# Patient Record
Sex: Male | Born: 1950 | Race: Black or African American | Hispanic: No | Marital: Single | Smoking: Current every day smoker
Health system: Southern US, Community
[De-identification: ages and names within clinical notes are randomized; demographics above are authoritative.]

## PROBLEM LIST (undated history)

## (undated) DIAGNOSIS — F419 Anxiety disorder, unspecified: Secondary | ICD-10-CM

## (undated) DIAGNOSIS — F1410 Cocaine abuse, uncomplicated: Secondary | ICD-10-CM

## (undated) DIAGNOSIS — R7303 Prediabetes: Secondary | ICD-10-CM

## (undated) DIAGNOSIS — N433 Hydrocele, unspecified: Secondary | ICD-10-CM

## (undated) DIAGNOSIS — L309 Dermatitis, unspecified: Secondary | ICD-10-CM

## (undated) DIAGNOSIS — E785 Hyperlipidemia, unspecified: Secondary | ICD-10-CM

## (undated) DIAGNOSIS — T7840XA Allergy, unspecified, initial encounter: Secondary | ICD-10-CM

## (undated) DIAGNOSIS — I10 Essential (primary) hypertension: Secondary | ICD-10-CM

## (undated) DIAGNOSIS — M1990 Unspecified osteoarthritis, unspecified site: Secondary | ICD-10-CM

## (undated) DIAGNOSIS — R351 Nocturia: Secondary | ICD-10-CM

## (undated) DIAGNOSIS — D649 Anemia, unspecified: Secondary | ICD-10-CM

## (undated) DIAGNOSIS — K219 Gastro-esophageal reflux disease without esophagitis: Secondary | ICD-10-CM

## (undated) DIAGNOSIS — F32A Depression, unspecified: Secondary | ICD-10-CM

## (undated) DIAGNOSIS — E119 Type 2 diabetes mellitus without complications: Secondary | ICD-10-CM

## (undated) HISTORY — DX: Allergy, unspecified, initial encounter: T78.40XA

## (undated) HISTORY — DX: Anemia, unspecified: D64.9

## (undated) HISTORY — DX: Dermatitis, unspecified: L30.9

## (undated) HISTORY — DX: Hyperlipidemia, unspecified: E78.5

## (undated) HISTORY — PX: TONSILLECTOMY: SUR1361

## (undated) HISTORY — PX: WISDOM TOOTH EXTRACTION: SHX21

## (undated) HISTORY — DX: Anxiety disorder, unspecified: F41.9

## (undated) HISTORY — DX: Depression, unspecified: F32.A

## (undated) HISTORY — DX: Type 2 diabetes mellitus without complications: E11.9

## (undated) HISTORY — DX: Unspecified osteoarthritis, unspecified site: M19.90

## (undated) HISTORY — DX: Prediabetes: R73.03

## (undated) HISTORY — DX: Gastro-esophageal reflux disease without esophagitis: K21.9

---

## 1999-04-06 ENCOUNTER — Emergency Department (HOSPITAL_COMMUNITY): Admission: EM | Admit: 1999-04-06 | Discharge: 1999-04-06 | Payer: Self-pay | Admitting: Emergency Medicine

## 2003-02-03 ENCOUNTER — Encounter: Payer: Self-pay | Admitting: Emergency Medicine

## 2003-02-03 ENCOUNTER — Inpatient Hospital Stay (HOSPITAL_COMMUNITY): Admission: EM | Admit: 2003-02-03 | Discharge: 2003-02-04 | Payer: Self-pay | Admitting: Emergency Medicine

## 2004-03-04 ENCOUNTER — Emergency Department (HOSPITAL_COMMUNITY): Admission: EM | Admit: 2004-03-04 | Discharge: 2004-03-04 | Payer: Self-pay

## 2005-04-19 ENCOUNTER — Ambulatory Visit: Payer: Self-pay | Admitting: Endocrinology

## 2014-12-27 DIAGNOSIS — N433 Hydrocele, unspecified: Secondary | ICD-10-CM

## 2014-12-27 DIAGNOSIS — E785 Hyperlipidemia, unspecified: Secondary | ICD-10-CM

## 2014-12-27 DIAGNOSIS — R7303 Prediabetes: Secondary | ICD-10-CM

## 2014-12-27 DIAGNOSIS — E782 Mixed hyperlipidemia: Secondary | ICD-10-CM | POA: Insufficient documentation

## 2014-12-27 DIAGNOSIS — I10 Essential (primary) hypertension: Secondary | ICD-10-CM

## 2014-12-27 DIAGNOSIS — R351 Nocturia: Secondary | ICD-10-CM

## 2014-12-27 HISTORY — DX: Hydrocele, unspecified: N43.3

## 2014-12-27 HISTORY — DX: Essential (primary) hypertension: I10

## 2014-12-27 HISTORY — DX: Prediabetes: R73.03

## 2014-12-27 HISTORY — DX: Nocturia: R35.1

## 2014-12-27 HISTORY — DX: Hyperlipidemia, unspecified: E78.5

## 2015-11-11 ENCOUNTER — Other Ambulatory Visit: Payer: Self-pay | Admitting: Urology

## 2015-12-01 ENCOUNTER — Encounter (HOSPITAL_BASED_OUTPATIENT_CLINIC_OR_DEPARTMENT_OTHER): Payer: Self-pay | Admitting: *Deleted

## 2015-12-02 ENCOUNTER — Encounter (HOSPITAL_BASED_OUTPATIENT_CLINIC_OR_DEPARTMENT_OTHER): Payer: Self-pay | Admitting: *Deleted

## 2015-12-02 NOTE — Progress Notes (Signed)
NPO AFTER MN.  ARRIVE AT 0745.  NEEDS ISTAT,  URINE DRUG SCREEN, AND EKG.

## 2015-12-05 ENCOUNTER — Ambulatory Visit (HOSPITAL_BASED_OUTPATIENT_CLINIC_OR_DEPARTMENT_OTHER): Payer: Self-pay | Admitting: Anesthesiology

## 2015-12-05 ENCOUNTER — Ambulatory Visit (HOSPITAL_BASED_OUTPATIENT_CLINIC_OR_DEPARTMENT_OTHER)
Admission: RE | Admit: 2015-12-05 | Discharge: 2015-12-05 | Disposition: A | Discharge status: Home / Self Care | Payer: Self-pay | Source: Ambulatory Visit | Attending: Urology | Admitting: Urology

## 2015-12-05 ENCOUNTER — Encounter (HOSPITAL_BASED_OUTPATIENT_CLINIC_OR_DEPARTMENT_OTHER): Payer: Self-pay

## 2015-12-05 ENCOUNTER — Encounter (HOSPITAL_BASED_OUTPATIENT_CLINIC_OR_DEPARTMENT_OTHER)
Admission: RE | Disposition: A | Discharge status: Home / Self Care | Payer: Self-pay | Source: Ambulatory Visit | Attending: Urology

## 2015-12-05 DIAGNOSIS — F17210 Nicotine dependence, cigarettes, uncomplicated: Secondary | ICD-10-CM | POA: Insufficient documentation

## 2015-12-05 DIAGNOSIS — N4340 Spermatocele of epididymis, unspecified: Secondary | ICD-10-CM | POA: Insufficient documentation

## 2015-12-05 DIAGNOSIS — I10 Essential (primary) hypertension: Secondary | ICD-10-CM | POA: Insufficient documentation

## 2015-12-05 HISTORY — PX: HYDROCELE EXCISION: SHX482

## 2015-12-05 HISTORY — DX: Essential (primary) hypertension: I10

## 2015-12-05 HISTORY — DX: Nocturia: R35.1

## 2015-12-05 HISTORY — DX: Hydrocele, unspecified: N43.3

## 2015-12-05 HISTORY — DX: Cocaine abuse, uncomplicated: F14.10

## 2015-12-05 HISTORY — PX: ORCHIOPEXY: SHX479

## 2015-12-05 LAB — POCT I-STAT 4, (NA,K, GLUC, HGB,HCT)
GLUCOSE: 104 mg/dL — AB (ref 65–99)
HEMATOCRIT: 40 % (ref 39.0–52.0)
HEMOGLOBIN: 13.6 g/dL (ref 13.0–17.0)
Potassium: 4.2 mmol/L (ref 3.5–5.1)
SODIUM: 138 mmol/L (ref 135–145)

## 2015-12-05 SURGERY — HYDROCELECTOMY
Anesthesia: General | Site: Scrotum | Laterality: Right

## 2015-12-05 MED ORDER — PHENYLEPHRINE HCL 10 MG/ML IJ SOLN
INTRAMUSCULAR | Status: DC | PRN
  Administered 2015-12-05 (×5): 80 ug via INTRAVENOUS

## 2015-12-05 MED ORDER — MIDAZOLAM HCL 2 MG/2ML IJ SOLN
INTRAMUSCULAR | Status: AC
  Filled 2015-12-05: qty 2

## 2015-12-05 MED ORDER — MEPERIDINE HCL 25 MG/ML IJ SOLN
6.2500 mg | INTRAMUSCULAR | Status: DC | PRN
  Filled 2015-12-05: qty 1

## 2015-12-05 MED ORDER — ONDANSETRON HCL 4 MG/2ML IJ SOLN
INTRAMUSCULAR | Status: AC
  Filled 2015-12-05: qty 2

## 2015-12-05 MED ORDER — SULFAMETHOXAZOLE-TRIMETHOPRIM 800-160 MG PO TABS: 1.0000 | ORAL_TABLET | Freq: Two times a day (BID) | ORAL | Status: DC

## 2015-12-05 MED ORDER — KETOROLAC TROMETHAMINE 30 MG/ML IJ SOLN
INTRAMUSCULAR | Status: AC
  Filled 2015-12-05: qty 1

## 2015-12-05 MED ORDER — HYDROMORPHONE HCL 1 MG/ML IJ SOLN
INTRAMUSCULAR | Status: AC
  Filled 2015-12-05: qty 1

## 2015-12-05 MED ORDER — ONDANSETRON HCL 4 MG/2ML IJ SOLN
INTRAMUSCULAR | Status: DC | PRN
  Administered 2015-12-05: 4 mg via INTRAVENOUS

## 2015-12-05 MED ORDER — FENTANYL CITRATE (PF) 100 MCG/2ML IJ SOLN
INTRAMUSCULAR | Status: DC | PRN
  Administered 2015-12-05 (×4): 50 ug via INTRAVENOUS

## 2015-12-05 MED ORDER — SENNOSIDES-DOCUSATE SODIUM 8.6-50 MG PO TABS: 1.0000 | ORAL_TABLET | Freq: Two times a day (BID) | ORAL | Status: DC

## 2015-12-05 MED ORDER — DEXAMETHASONE SODIUM PHOSPHATE 4 MG/ML IJ SOLN
INTRAMUSCULAR | Status: DC | PRN
  Administered 2015-12-05: 10 mg via INTRAVENOUS

## 2015-12-05 MED ORDER — LIDOCAINE HCL (CARDIAC) 20 MG/ML IV SOLN
INTRAVENOUS | Status: AC
  Filled 2015-12-05: qty 5

## 2015-12-05 MED ORDER — PROPOFOL 10 MG/ML IV BOLUS
INTRAVENOUS | Status: DC | PRN
  Administered 2015-12-05: 50 mg via INTRAVENOUS
  Administered 2015-12-05: 200 mg via INTRAVENOUS

## 2015-12-05 MED ORDER — DEXAMETHASONE SODIUM PHOSPHATE 10 MG/ML IJ SOLN
INTRAMUSCULAR | Status: AC
  Filled 2015-12-05: qty 1

## 2015-12-05 MED ORDER — CLINDAMYCIN PHOSPHATE 900 MG/50ML IV SOLN
900.0000 mg | INTRAVENOUS | Status: AC
  Administered 2015-12-05: 900 mg via INTRAVENOUS
  Filled 2015-12-05: qty 50

## 2015-12-05 MED ORDER — LIDOCAINE HCL (CARDIAC) 20 MG/ML IV SOLN
INTRAVENOUS | Status: DC | PRN
  Administered 2015-12-05: 100 mg via INTRAVENOUS

## 2015-12-05 MED ORDER — FENTANYL CITRATE (PF) 100 MCG/2ML IJ SOLN
INTRAMUSCULAR | Status: AC
  Filled 2015-12-05: qty 2

## 2015-12-05 MED ORDER — CLINDAMYCIN PHOSPHATE 900 MG/50ML IV SOLN
INTRAVENOUS | Status: AC
  Filled 2015-12-05: qty 50

## 2015-12-05 MED ORDER — HYDROMORPHONE HCL 1 MG/ML IJ SOLN
0.2500 mg | INTRAMUSCULAR | Status: DC | PRN
  Administered 2015-12-05 (×2): 0.5 mg via INTRAVENOUS
  Filled 2015-12-05: qty 1

## 2015-12-05 MED ORDER — BUPIVACAINE HCL (PF) 0.25 % IJ SOLN
INTRAMUSCULAR | Status: DC | PRN
  Administered 2015-12-05: 10 mL

## 2015-12-05 MED ORDER — BUPIVACAINE HCL (PF) 0.25 % IJ SOLN
INTRAMUSCULAR | Status: AC
  Filled 2015-12-05: qty 30

## 2015-12-05 MED ORDER — PROPOFOL 500 MG/50ML IV EMUL
INTRAVENOUS | Status: AC
  Filled 2015-12-05: qty 50

## 2015-12-05 MED ORDER — KETOROLAC TROMETHAMINE 30 MG/ML IJ SOLN
INTRAMUSCULAR | Status: DC | PRN
  Administered 2015-12-05: 30 mg via INTRAVENOUS

## 2015-12-05 MED ORDER — OXYCODONE HCL 5 MG/5ML PO SOLN
5.0000 mg | Freq: Once | ORAL | Status: DC | PRN
  Filled 2015-12-05: qty 5

## 2015-12-05 MED ORDER — OXYCODONE HCL 5 MG PO TABS
5.0000 mg | ORAL_TABLET | Freq: Once | ORAL | Status: DC | PRN
  Filled 2015-12-05: qty 1

## 2015-12-05 MED ORDER — MIDAZOLAM HCL 5 MG/5ML IJ SOLN
INTRAMUSCULAR | Status: DC | PRN
  Administered 2015-12-05: 2 mg via INTRAVENOUS

## 2015-12-05 MED ORDER — LACTATED RINGERS IV SOLN
INTRAVENOUS | Status: DC
  Administered 2015-12-05: 09:00:00 via INTRAVENOUS
  Filled 2015-12-05: qty 1000

## 2015-12-05 MED ORDER — PROMETHAZINE HCL 25 MG/ML IJ SOLN
6.2500 mg | INTRAMUSCULAR | Status: DC | PRN
  Filled 2015-12-05: qty 1

## 2015-12-05 MED ORDER — OXYCODONE-ACETAMINOPHEN 5-325 MG PO TABS: 1.0000 | ORAL_TABLET | Freq: Four times a day (QID) | ORAL | Status: DC | PRN

## 2015-12-05 SURGICAL SUPPLY — 48 items
BLADE CLIPPER SURG (BLADE) ×3 IMPLANT
BLADE HEX COATED 2.75 (ELECTRODE) ×3 IMPLANT
BLADE SURG 15 STRL LF DISP TIS (BLADE) ×1 IMPLANT
BLADE SURG 15 STRL SS (BLADE) ×2
BNDG GAUZE ELAST 4 BULKY (GAUZE/BANDAGES/DRESSINGS) ×3 IMPLANT
BRIEF STRETCH FOR OB PAD LRG (UNDERPADS AND DIAPERS) ×3 IMPLANT
CANISTER SUCT 3000ML PPV (MISCELLANEOUS) ×3 IMPLANT
COVER BACK TABLE 60X90IN (DRAPES) ×3 IMPLANT
COVER MAYO STAND STRL (DRAPES) ×3 IMPLANT
DISSECTOR ROUND CHERRY 3/8 STR (MISCELLANEOUS) IMPLANT
DRAIN PENROSE 18X1/2 LTX STRL (DRAIN) IMPLANT
DRAIN PENROSE 18X1/4 LTX STRL (WOUND CARE) ×3 IMPLANT
DRAPE PED LAPAROTOMY (DRAPES) ×3 IMPLANT
DRSG TEGADERM 4X4.75 (GAUZE/BANDAGES/DRESSINGS) IMPLANT
ELECT REM PT RETURN 9FT ADLT (ELECTROSURGICAL) ×3
ELECTRODE REM PT RTRN 9FT ADLT (ELECTROSURGICAL) ×1 IMPLANT
GLOVE BIO SURGEON STRL SZ7 (GLOVE) ×3 IMPLANT
GLOVE BIO SURGEON STRL SZ7.5 (GLOVE) ×3 IMPLANT
GLOVE BIOGEL PI IND STRL 7.5 (GLOVE) ×2 IMPLANT
GLOVE BIOGEL PI INDICATOR 7.5 (GLOVE) ×4
GOWN STRL REUS W/ TWL LRG LVL3 (GOWN DISPOSABLE) ×1 IMPLANT
GOWN STRL REUS W/ TWL XL LVL3 (GOWN DISPOSABLE) ×1 IMPLANT
GOWN STRL REUS W/TWL LRG LVL3 (GOWN DISPOSABLE) ×2
GOWN STRL REUS W/TWL XL LVL3 (GOWN DISPOSABLE) ×2
KIT ROOM TURNOVER WOR (KITS) ×3 IMPLANT
LIQUID BAND (GAUZE/BANDAGES/DRESSINGS) IMPLANT
MANIFOLD NEPTUNE II (INSTRUMENTS) IMPLANT
NEEDLE HYPO 25X1 1.5 SAFETY (NEEDLE) ×3 IMPLANT
NS IRRIG 500ML POUR BTL (IV SOLUTION) ×3 IMPLANT
PACK BASIN DAY SURGERY FS (CUSTOM PROCEDURE TRAY) ×3 IMPLANT
PENCIL BUTTON HOLSTER BLD 10FT (ELECTRODE) ×3 IMPLANT
SUPPORT SCROTAL LG STRP (MISCELLANEOUS) IMPLANT
SUPPORTER ATHLETIC LG (MISCELLANEOUS)
SUT ETHILON 3 0 PS 1 (SUTURE) IMPLANT
SUT MNCRL AB 4-0 PS2 18 (SUTURE) ×3 IMPLANT
SUT PROLENE 4 0 PS 2 18 (SUTURE) ×3 IMPLANT
SUT SILK 2 0 FS (SUTURE) ×3 IMPLANT
SUT VIC AB 2-0 SH 27 (SUTURE)
SUT VIC AB 2-0 SH 27XBRD (SUTURE) IMPLANT
SUT VIC AB 4-0 SH 27 (SUTURE) ×2
SUT VIC AB 4-0 SH 27XANBCTRL (SUTURE) ×1 IMPLANT
SYR CONTROL 10ML LL (SYRINGE) ×3 IMPLANT
TOWEL OR 17X24 6PK STRL BLUE (TOWEL DISPOSABLE) ×6 IMPLANT
TRAY DSU PREP LF (CUSTOM PROCEDURE TRAY) ×3 IMPLANT
TUBE CONNECTING 12'X1/4 (SUCTIONS) ×1
TUBE CONNECTING 12X1/4 (SUCTIONS) ×2 IMPLANT
WATER STERILE IRR 500ML POUR (IV SOLUTION) IMPLANT
YANKAUER SUCT BULB TIP NO VENT (SUCTIONS) ×3 IMPLANT

## 2015-12-05 NOTE — Anesthesia Procedure Notes (Signed)
Procedure Name: LMA Insertion Date/Time: 12/05/2015 8:44 AM Performed by: Norva PavlovALLAWAY, Dakwon Wenberg G Pre-anesthesia Checklist: Patient identified, Emergency Drugs available, Suction available and Patient being monitored Patient Re-evaluated:Patient Re-evaluated prior to inductionOxygen Delivery Method: Circle System Utilized Preoxygenation: Pre-oxygenation with 100% oxygen Intubation Type: IV induction Ventilation: Mask ventilation without difficulty LMA: LMA inserted LMA Size: 4.0 Number of attempts: 1 Airway Equipment and Method: bite block Placement Confirmation: positive ETCO2 Tube secured with: Tape Dental Injury: Teeth and Oropharynx as per pre-operative assessment

## 2015-12-05 NOTE — Transfer of Care (Signed)
Last Vitals:  Filed Vitals:   12/05/15 0753  BP: 140/62  Pulse: 85  Temp: 36.8 C  Resp: 16    Immediate Anesthesia Transfer of Care Note  Patient: [name]  Procedure(s) Performed: Procedure(s) (LRB): RIGHT HYDROCELECTOMY ADULT (Right) ORCHIOPEXY/ SPERMATOCELECTOMY  ADULT (Right)  Patient Location: PACU  Anesthesia Type: General  Level of Consciousness: awake, alert  and oriented  Airway & Oxygen Therapy: Patient Spontanous Breathing and Patient connected to face mask oxygen  Post-op Assessment: Report given to PACU RN and Post -op Vital signs reviewed and stable  Post vital signs: Reviewed and stable  Complications: No apparent anesthesia complications

## 2015-12-05 NOTE — Anesthesia Postprocedure Evaluation (Signed)
Anesthesia Post Note  Patient: Jeffrey Stafford  Procedure(s) Performed: Procedure(s) (LRB): RIGHT HYDROCELECTOMY ADULT (Right) ORCHIOPEXY/ SPERMATOCELECTOMY  ADULT (Right)  Patient location during evaluation: PACU Anesthesia Type: General Level of consciousness: sedated and patient cooperative Pain management: pain level controlled Vital Signs Assessment: post-procedure vital signs reviewed and stable Respiratory status: spontaneous breathing Cardiovascular status: stable Anesthetic complications: no    Last Vitals:  Filed Vitals:   12/05/15 1030 12/05/15 1035  BP: 135/67   Pulse: 73 70  Temp:    Resp: 17 14    Last Pain:  Filed Vitals:   12/05/15 1142  PainSc: 2                  [name]

## 2015-12-05 NOTE — H&P (Signed)
Jeffrey Stafford is an 64 y.o. male.    Chief Complaint: Pre-op Right Hydrocelectomy  HPI:   1 - Right Hydrocele - PT with Rt scrotal swelling x 2 years that is progressive. [facility] confirms hydrocele, no solid testicular masses. Estimated size 12cm. Exam also corroborate likelky hydrocele w/o hernia. THis has become quite bothersome interfearing with activites and ambulation.   PMH sig for HTN. His PCP is Julieanne MansonElizabeth Mulberry with [facility].  Today " Jeffrey Stafford " is seen to proceed with elective right hydrocelectomy. No interval fevers.    Past Medical History  Diagnosis Date  . Right hydrocele   . Hypertension   . Borderline diabetes   . Cocaine substance abuse   . Nocturia     Past Surgical History  Procedure Laterality Date  . No past surgeries      History reviewed. No pertinent family history. Social History:  reports that he has been smoking Cigarettes.  He has a 12.5 pack-year smoking history. He has never used smokeless tobacco. He reports that he uses illicit drugs (Cocaine). He reports that he does not drink alcohol.  Allergies: No Known Allergies  No prescriptions prior to admission    No results found for this or any previous visit (from the past 48 hour(s)). No results found.  Review of Systems  Constitutional: Negative.   HENT: Negative.   Eyes: Negative.   Respiratory: Negative.   Cardiovascular: Negative.   Gastrointestinal: Negative.   Genitourinary: Negative.   Musculoskeletal: Negative.   Skin: Negative.   Neurological: Negative.   Endo/Heme/Allergies: Negative.   Psychiatric/Behavioral: Negative.     Height 5\' 9"  (1.753 m), weight 77.111 kg (170 lb). Physical Exam  Constitutional: He appears well-developed.  HENT:  Head: Normocephalic.  Eyes: Pupils are equal, round, and reactive to light.  Cardiovascular: Normal rate.   Respiratory: Effort normal.  GI: Soft.  Genitourinary:  Right scrotal swelling  c/w known hydrocele  Musculoskeletal: Normal range of motion.  Neurological: He is alert.  Skin: Skin is warm.  Psychiatric: He has a normal mood and affect. His behavior is normal. Judgment and thought content normal.     Assessment/Plan    1 - Right Hydrocele -  We rediscussed operative hydrocelecotmy in detail including usual outpatient perioperative course with >50% of cases requiring temporary penrose drian to reduce risk of hematoma as well as importance of post-op activity limitation and scrotal support. We rediscussed risks including recurrence, hematoma / bleeding, infection as well as rare risks such as DVT, PE, MI, CVA, Mortality.   The patient voiced understanding and wants to proceed. Will leave scrotal drain given hydrocele size to help prevent hematoma.    Eulas Schweitzer 12/05/2015, 5:45 AM

## 2015-12-05 NOTE — Anesthesia Preprocedure Evaluation (Signed)
Anesthesia Evaluation  Patient identified by MRN, date of birth, ID band Patient awake    Reviewed: Allergy & Precautions, NPO status , Patient's Chart, lab work & pertinent test results  Airway Mallampati: II  TM Distance: >3 FB Neck ROM: Full    Dental no notable dental hx.    Pulmonary Current Smoker,    Pulmonary exam normal breath sounds clear to auscultation       Cardiovascular hypertension, negative cardio ROS Normal cardiovascular exam Rhythm:Regular Rate:Normal     Neuro/Psych negative neurological ROS  negative psych ROS   GI/Hepatic negative GI ROS, (+)     substance abuse  cocaine use,   Endo/Other  negative endocrine ROS  Renal/GU negative Renal ROS     Musculoskeletal negative musculoskeletal ROS (+)   Abdominal   Peds  Hematology negative hematology ROS (+)   Anesthesia Other Findings   Reproductive/Obstetrics                             Anesthesia Physical Anesthesia Plan  ASA: II  Anesthesia Plan: General   Post-op Pain Management:    Induction: Intravenous  Airway Management Planned: LMA  Additional Equipment:   Intra-op Plan:   Post-operative Plan: Extubation in OR  Informed Consent: I have reviewed the patients History and Physical, chart, labs and discussed the procedure including the risks, benefits and alternatives for the proposed anesthesia with the patient or authorized representative who has indicated his/her understanding and acceptance.   Dental advisory given  Plan Discussed with: CRNA  Anesthesia Plan Comments:         Anesthesia Quick Evaluation

## 2015-12-05 NOTE — Discharge Instructions (Signed)
1 - You may have small amount of blood drianage and moderate scrotal swelling with drain in place. This is normal.  2 - Call MD or go to ER for fever >102, severe pain / nausea / vomiting not relieved by medications, or acute change in medical status     HOME CARE INSTRUCTIONS FOR SCROTAL PROCEDURES  Wound Care & Hygiene: You may apply an ice bag to the scrotum for the first 24 hours.  This may help decrease swelling and soreness.  You may have a dressing held in place by an athletic supporter.  You may remove the dressing in 24 hours and shower in 48 hours.  Continue to use the athletic supporter or tight briefs for at least a week. Activity: Rest today - not necessarily flat bed rest.  Just take it easy.  You should not do strenuous activities until your follow-up visit with your doctor.  You may resume light activity in 48 hours.  Return to Work:  Your doctor will advise you of this depending on the type of work you do  Diet: Drink liquids or eat a light diet this evening.  You may resume a regular diet tomorrow.  General Expectations: You may have a small amount of bleeding.  The scrotum may be swollen or bruised for about a week.  Call your Doctor if these occur:  -persistent or heavy bleeding  -temperature of 101 degrees or more  -severe pain, not relieved by your pain medication  [facility]:  Call to set up and appointment.     Post Anesthesia Home Care Instructions  Activity: Get plenty of rest for the remainder of the day. A responsible adult should stay with you for 24 hours following the procedure.  For the next 24 hours, DO NOT: -Drive a car -Advertising copywriterperate machinery -Drink alcoholic beverages -Take any medication unless instructed by your physician -Make any legal decisions or sign important papers.  Meals: Start with liquid foods such as gelatin or soup. Progress to regular foods as tolerated. Avoid greasy, spicy, heavy foods. If nausea and/or  vomiting occur, drink only clear liquids until the nausea and/or vomiting subsides. Call your physician if vomiting continues.  Special Instructions/Symptoms: Your throat may feel dry or sore from the anesthesia or the breathing tube placed in your throat during surgery. If this causes discomfort, gargle with warm salt water. The discomfort should disappear within 24 hours.  If you had a scopolamine patch placed behind your ear for the management of post- operative nausea and/or vomiting:  1. The medication in the patch is effective for 72 hours, after which it should be removed.  Wrap patch in a tissue and discard in the trash. Wash hands thoroughly with soap and water. 2. You may remove the patch earlier than 72 hours if you experience unpleasant side effects which may include dry mouth, dizziness or visual disturbances. 3. Avoid touching the patch. Wash your hands with soap and water after contact with the patch.

## 2015-12-05 NOTE — Brief Op Note (Signed)
12/05/2015  9:31 AM  PATIENT:  Jeffrey Macconnell DemarkJames R Twyman  64 y.o. male  PRE-OPERATIVE DIAGNOSIS:  LARGE RIGHT HYDROCELE  POST-OPERATIVE DIAGNOSIS:  LARGE RIGHT HYDROCEL/ RIGHT SPERMATOCELE  PROCEDURE:  Procedure(s): RIGHT HYDROCELECTOMY ADULT (Right) ORCHIOPEXY/ SPERMATOCELECTOMY  ADULT (Right)  SURGEON:  Surgeon(s) and Role:    * Sebastian Acheheodore Dene Landsberg, MD - Primary  PHYSICIAN ASSISTANT:   ASSISTANTS: none   ANESTHESIA:   local and general  EBL:  Total I/O In: 200 [I.V.:200] Out: -   BLOOD ADMINISTERED:none  DRAINS: Penrose drain in the dependant scrotum to wound drainage   LOCAL MEDICATIONS USED:  MARCAINE     SPECIMEN:  Source of Specimen:  right spermatocele  DISPOSITION OF SPECIMEN:  PATHOLOGY  COUNTS:  YES  TOURNIQUET:  * No tourniquets in log *  DICTATION: .Other Dictation: Dictation Number [id number]  PLAN OF CARE: Discharge to home after PACU  PATIENT DISPOSITION:  PACU - hemodynamically stable.   Delay start of Pharmacological VTE agent (>24hrs) due to surgical blood loss or risk of bleeding: yes

## 2015-12-05 NOTE — Progress Notes (Signed)
Dr. Berneice HeinrichManny called back after page, reported that patient admits to cocaine use. Okay to proceed with surgery.

## 2015-12-08 ENCOUNTER — Encounter (HOSPITAL_BASED_OUTPATIENT_CLINIC_OR_DEPARTMENT_OTHER): Payer: Self-pay | Admitting: Urology

## 2015-12-08 NOTE — Op Note (Signed)
NAME:  Jeffrey Stafford, Jeffrey Stafford NO.:  [id number]  MEDICAL RECORD NO.:  [id number]  LOCATION:                               FACILITY:  [facility]  PHYSICIAN:  Sebastian Ache, MD     DATE OF BIRTH:  Jan 26, 1951  DATE OF PROCEDURE:  12/05/2015                              OPERATIVE REPORT  PREOPERATIVE DIAGNOSIS:  Right hydrocele versus spermatocele.  POSTOPERATIVE DIAGNOSIS:  Large right spermatocele and testicular mobility.  PROCEDURES: 1. Right spermatocelectomy. 2. Right orchidopexy.  ESTIMATED BLOOD LOSS:  Nil.  COMPLICATIONS:  None.  SPECIMENS:  Right spermatocele to permanent pathology.  FINDINGS: 1. Large right multilobular right spermatocele. 2. Excessive mobility of testicle and vas and vessels following     spermatocelectomy, this necessitated right orchidopexy.  DRAINS: 1. Penrose drain in the dependent scrotum. 2. Wound drainage.  INDICATION:  Jeffrey Stafford is a 64 year old gentleman with longstanding progressive history of right scrotal swelling and heaviness.  He was found on workup of this to have a fluid-filled structure consistent with likely hydrocele versus spermatocele on outside imaging and he adamantly wished for operative management.  We did discuss options including surveillance versus sclerotherapy versus surgical extirpation.  He wished to proceed with the latter.  Informed consent was obtained and placed in the medical record.  PROCEDURE IN DETAIL:  The patient being Jeffrey Stafford, was verified. Procedure being right hydrocelectomy versus spermatocelectomy was confirmed.  Procedure was carried out.  Time-out was performed. Intravenous antibiotics were administered.  General anesthesia was introduced.  The patient was placed into a supine position and sterile field was created by prepping and draping the patient's penis, perineum and scrotum using iodinated prep.  The incision was made along the median raphe approximately 4 cm along the  anterior dependent scrotum directly on the area of scrotal swelling.  Dissection was carried down through the dartos and outer tunical layers and the area of scrotal swelling was delivered into the operative field.  Upon inspection, this was found to be actually a large multiloculated spermatocele and not a hydrocele.  The vas and testicular vessels were identified and very carefully dissected away from the area of spermatocele as was the testicle was very carefully dissected away from the area of spermatocele.  Such that the spermatocele remained on its small epididymal stock, which was then transected with the cautery and the spermatocele was delivered for permanent pathology.  Following these maneuvers, the testicle appeared grossly viable.  There was no visible transection of the vas or gonadal vessels.  The testicle was very mobile and the vas and vessels were extended approximately 10 inches below the level of the scrotum.  It was felt that this was worrisome for an impending torsion without maneuvers to anchor the testicle and it was felt that clearly orchidopexy would be warranted.  As such, the testicle was carefully oriented with vessels not twisted and making sure that the lateral sulcus was lateral and orchidopexy was performed using a 4-0 Prolene anchoring to the right lateral hemiscrotum, dartos and the mesorchium testis on the right taking great care to avoid skin perforation, which did not occur.  A separate anchor stitch was applied inferiorly at  the inferior dartos and inferior mesorchium, again making sure no skin violation occurred.  Next, the Penrose drain was brought into the right hemiscrotum via an inferior counterincision less than 1 cm in length.  Drain stitch was applied to this.  Hemostasis appeared excellent.  The dartos was reapproximated using running Vicryl and the skin reapproximated using running Monocryl.  A dressing of fluff and mesh panties were  applied, and procedure was terminated.  The patient tolerated the procedure well.  There were no immediate periprocedural complications.  The patient was taken to the postanesthesia care unit in stable condition.          ______________________________ Sebastian Acheheodore Fread Kottke, MD     TM/MEDQ  D:  12/05/2015  T:  12/06/2015  Job:  [id number]

## 2015-12-28 DIAGNOSIS — L309 Dermatitis, unspecified: Secondary | ICD-10-CM | POA: Insufficient documentation

## 2015-12-28 HISTORY — DX: Dermatitis, unspecified: L30.9

## 2016-10-30 ENCOUNTER — Other Ambulatory Visit: Payer: Self-pay | Admitting: Internal Medicine

## 2016-12-30 ENCOUNTER — Encounter: Payer: Self-pay | Admitting: Internal Medicine

## 2016-12-30 ENCOUNTER — Ambulatory Visit (INDEPENDENT_AMBULATORY_CARE_PROVIDER_SITE_OTHER): Payer: Medicare Other | Admitting: Internal Medicine

## 2016-12-30 VITALS — BP 160/90 | HR 76 | Resp 12 | Ht 66.5 in | Wt 161.0 lb

## 2016-12-30 DIAGNOSIS — R918 Other nonspecific abnormal finding of lung field: Secondary | ICD-10-CM

## 2016-12-30 DIAGNOSIS — R7303 Prediabetes: Secondary | ICD-10-CM | POA: Diagnosis not present

## 2016-12-30 DIAGNOSIS — K029 Dental caries, unspecified: Secondary | ICD-10-CM | POA: Diagnosis not present

## 2016-12-30 DIAGNOSIS — F17200 Nicotine dependence, unspecified, uncomplicated: Secondary | ICD-10-CM | POA: Insufficient documentation

## 2016-12-30 DIAGNOSIS — L309 Dermatitis, unspecified: Secondary | ICD-10-CM | POA: Diagnosis not present

## 2016-12-30 DIAGNOSIS — E782 Mixed hyperlipidemia: Secondary | ICD-10-CM | POA: Diagnosis not present

## 2016-12-30 DIAGNOSIS — Z720 Tobacco use: Secondary | ICD-10-CM | POA: Insufficient documentation

## 2016-12-30 DIAGNOSIS — F1410 Cocaine abuse, uncomplicated: Secondary | ICD-10-CM | POA: Insufficient documentation

## 2016-12-30 DIAGNOSIS — H547 Unspecified visual loss: Secondary | ICD-10-CM | POA: Diagnosis not present

## 2016-12-30 DIAGNOSIS — I10 Essential (primary) hypertension: Secondary | ICD-10-CM

## 2016-12-30 DIAGNOSIS — R739 Hyperglycemia, unspecified: Secondary | ICD-10-CM

## 2016-12-30 LAB — GLUCOSE, POCT (MANUAL RESULT ENTRY): POC GLUCOSE: 116 mg/dL — AB (ref 70–99)

## 2016-12-30 MED ORDER — LISINOPRIL 10 MG PO TABS: ORAL_TABLET | ORAL | 11 refills | Status: DC

## 2016-12-30 NOTE — Patient Instructions (Signed)
Tobacco Cessation:   1800QUITNOW or [phone], the former for support and possibly free nicotine patches/gum and support; the latter for [facility] Smoking cessation class. Get rid of all smoking supplies:  Cigarettes, lighters, ashtrays--no stashes just in case at home if you are serious.  For nicotine patches:  Stop smoking anything the day you start the first patch Start with 21 mg patch and reapply new to different area of skin every 24 hours for 30 days. Then 14 mg patch changed every 24 hours for 14 days. Then 7 mg patch changed every 24 hours for 14 days.  Drink a glass of water before every meal Drink 6-8 glasses of water daily Eat three meals daily Eat a protein and healthy fat with every meal (eggs,fish, chicken, turkey and limit red meats) Eat 5 servings of vegetables daily, mix the colors Eat 2 servings of fruit daily with skin, if skin is edible Use smaller plates Put food/utensils down as you chew and swallow each bite Eat at a table with friends/family at least once daily, no TV Do not eat in front of the TV  

## 2016-12-30 NOTE — Progress Notes (Signed)
Subjective:    Patient ID: Jeffrey Stafford, male    DOB: 1951-07-09, 66 y.o.   MRN: [id number]  HPI   Here for first time since we moved to [place].  Previously in [facility]  1.  Decreased Visual acuity:  Vision gets blurred from time to time.  Has not used reading glasses.  Feels he has floaters at times in right eye.  Has not been to an eye doctor in some time.   2.  Dental:  Needs dental care--teeth falling out.  Does not brush teeth regularly no floss.  3.  Essential Hypertension:  Was off Lisinopril for a while until November.  Was taking regularly until about 10 days ago, when he lost his Rx.  Discussed he could fill early and just pay $4 out of pocket to fill again.  4.  Right fingertips in past month tingling and turning white.  No cyanosis or deep red.  Has occurred maybe 4-5 times when he gets cold.  Lasts about 10-15 minutes until he gets warm.  Current Meds  Medication Sig  . lisinopril (PRINIVIL,ZESTRIL) 10 MG tablet TAKE ONE TABLET BY MOUTH ONCE DAILY WITH MEALS   No Known Allergies   Past Medical History:  Diagnosis Date  . Cocaine substance abuse 2014-2015  . Eczema 2017  . Hyperlipidemia 2016  . Hypertension 2016  . Nocturia 2016  . Prediabetes 2016  . Right hydrocele 2016   Surgical removal 11/2015    Past Surgical History:  Procedure Laterality Date  . HYDROCELE EXCISION Right 12/05/2015   Procedure: RIGHT HYDROCELECTOMY ADULT;  Surgeon: Sebastian Acheheodore Manny, MD;  [facility];  Service: Urology;  Laterality: Right;  . ORCHIOPEXY Right 12/05/2015   Procedure: ORCHIOPEXY/ SPERMATOCELECTOMY  ADULT;  Surgeon: Sebastian Acheheodore Manny, MD;  [facility];  Service: Urology;  Laterality: Right;    Family History  Problem Relation Age of Onset  . Diabetes Mother   . Heart disease Mother     Valvular Cardiomyopathy  . Pulmonary embolism Father     complication of amputation of leg for gangrene  . Peripheral vascular disease  Father   . Alcohol abuse Father   . Cirrhosis Brother     alcoholic--had shunt  . Lung cancer Brother     had removal of spot from lung  . Alcohol abuse Brother     after death of son  . Drug abuse Brother     Following death of son  . Kidney disease Brother     not clear of cause:  exposure [place] something [place] in [place]  Peritoneal dialysis.  developed skin infection around catheter and died      Review of Systems  Cardiovascular:       No leg pain with physical activity/walking       Objective:   Physical Exam  NAD HEENT:  PERRL, EOMI, discs sharp, but unable to see well, TMs pearly gray, few remaining lower teeth.  Several teeth broken off in upper right gingiva. Neck:  Supple, no adenopathy or thyromegaly Chest:  Mildly decreased BS, rhonchi in posterior LLL with end expiratory wheeze even after deep cough. CV:  RRR with normal S1 and S2, No S3, S4 or murmur.  No carotid bruits.  Carotid, radial and left DP pulse normal and equal,  Right DP decreased        Assessment & Plan:  1.  Decreased Visual Acuity/floaters:  Referral to Dr Dione BoozeGroat.  2.  Dental Decay:  Handout with list of Medicaid Dental providers  3.  Essential Hypertension:  Encouraged never running out of medication.  Return for fasting CMP, CBC  4.  Hyperlipidemia:  Return for fasting lipids  5.  Prediabetes:  Return for A1C  6.  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  Avoid holding cold bottles or cans  7.  Tobacco Abuse: Gave contact information for support.  Recommended patches.  8.  Abnormal lung exam:  CXR particularly with history of long term tobacco use.

## 2017-01-06 ENCOUNTER — Other Ambulatory Visit: Payer: Medicare Other

## 2017-01-06 DIAGNOSIS — Z79899 Other long term (current) drug therapy: Secondary | ICD-10-CM | POA: Insufficient documentation

## 2017-01-06 DIAGNOSIS — R7303 Prediabetes: Secondary | ICD-10-CM

## 2017-01-06 DIAGNOSIS — E782 Mixed hyperlipidemia: Secondary | ICD-10-CM

## 2017-01-07 ENCOUNTER — Ambulatory Visit
Admission: RE | Admit: 2017-01-07 | Discharge: 2017-01-07 | Disposition: A | Discharge status: Home / Self Care | Payer: Medicaid Other | Source: Ambulatory Visit | Attending: Internal Medicine | Admitting: Internal Medicine

## 2017-01-07 DIAGNOSIS — R918 Other nonspecific abnormal finding of lung field: Secondary | ICD-10-CM

## 2017-01-07 LAB — COMPREHENSIVE METABOLIC PANEL
ALBUMIN: 4.3 g/dL (ref 3.6–4.8)
ALT: 21 IU/L (ref 0–44)
AST: 37 IU/L (ref 0–40)
Albumin/Globulin Ratio: 1.4 (ref 1.2–2.2)
Alkaline Phosphatase: 133 IU/L — ABNORMAL HIGH (ref 39–117)
BUN / CREAT RATIO: 8 — AB (ref 10–24)
BUN: 10 mg/dL (ref 8–27)
Bilirubin Total: 0.7 mg/dL (ref 0.0–1.2)
CALCIUM: 9.4 mg/dL (ref 8.6–10.2)
CO2: 24 mmol/L (ref 18–29)
Chloride: 95 mmol/L — ABNORMAL LOW (ref 96–106)
Creatinine, Ser: 1.27 mg/dL (ref 0.76–1.27)
GFR, EST AFRICAN AMERICAN: 68 mL/min/{1.73_m2} (ref >59)
GFR, EST NON AFRICAN AMERICAN: 59 mL/min/{1.73_m2} — AB (ref >59)
Globulin, Total: 3.1 g/dL (ref 1.5–4.5)
Glucose: 101 mg/dL — ABNORMAL HIGH (ref 65–99)
Potassium: 5.5 mmol/L — ABNORMAL HIGH (ref 3.5–5.2)
Sodium: 136 mmol/L (ref 134–144)
TOTAL PROTEIN: 7.4 g/dL (ref 6.0–8.5)

## 2017-01-07 LAB — CBC WITH DIFFERENTIAL/PLATELET
BASOS: 1 %
Basophils Absolute: 0 10*3/uL (ref 0.0–0.2)
EOS (ABSOLUTE): 0.3 10*3/uL (ref 0.0–0.4)
EOS: 3 %
HEMATOCRIT: 43 % (ref 37.5–51.0)
Hemoglobin: 13.7 g/dL (ref 13.0–17.7)
IMMATURE GRANS (ABS): 0 10*3/uL (ref 0.0–0.1)
IMMATURE GRANULOCYTES: 0 %
LYMPHS: 37 %
Lymphocytes Absolute: 3.3 10*3/uL — ABNORMAL HIGH (ref 0.7–3.1)
MCH: 28.1 pg (ref 26.6–33.0)
MCHC: 31.9 g/dL (ref 31.5–35.7)
MCV: 88 fL (ref 79–97)
Monocytes Absolute: 0.9 10*3/uL (ref 0.1–0.9)
Monocytes: 10 %
NEUTROS PCT: 49 %
Neutrophils Absolute: 4.4 10*3/uL (ref 1.4–7.0)
Platelets: 361 10*3/uL (ref 150–379)
RBC: 4.88 x10E6/uL (ref 4.14–5.80)
RDW: 15.2 % (ref 12.3–15.4)
WBC: 8.8 10*3/uL (ref 3.4–10.8)

## 2017-01-07 LAB — LIPID PANEL W/O CHOL/HDL RATIO
Cholesterol, Total: 224 mg/dL — ABNORMAL HIGH (ref 100–199)
HDL: 67 mg/dL (ref >39)
LDL CALC: 139 mg/dL — AB (ref 0–99)
Triglycerides: 88 mg/dL (ref 0–149)
VLDL CHOLESTEROL CAL: 18 mg/dL (ref 5–40)

## 2017-01-07 LAB — HGB A1C W/O EAG: Hgb A1c MFr Bld: 6.1 % — ABNORMAL HIGH (ref 4.8–5.6)

## 2017-01-11 NOTE — Progress Notes (Signed)
Spoke with patient results given. Patient wanted us to know he also saw the dentist today.

## 2017-01-14 NOTE — Progress Notes (Signed)
Spoke with patient and lab results given. Appointments scheduled for follow up labs

## 2017-01-28 ENCOUNTER — Other Ambulatory Visit: Payer: Self-pay

## 2017-03-01 ENCOUNTER — Ambulatory Visit: Payer: Medicaid Other | Admitting: Internal Medicine

## 2017-03-30 ENCOUNTER — Encounter: Payer: Self-pay | Admitting: Internal Medicine

## 2017-03-30 ENCOUNTER — Ambulatory Visit (INDEPENDENT_AMBULATORY_CARE_PROVIDER_SITE_OTHER): Payer: Medicare Other | Admitting: Internal Medicine

## 2017-03-30 VITALS — BP 122/70 | HR 76 | Resp 12 | Ht 66.0 in | Wt 169.0 lb

## 2017-03-30 DIAGNOSIS — H8112 Benign paroxysmal vertigo, left ear: Secondary | ICD-10-CM

## 2017-03-30 DIAGNOSIS — I10 Essential (primary) hypertension: Secondary | ICD-10-CM | POA: Diagnosis not present

## 2017-03-30 DIAGNOSIS — E875 Hyperkalemia: Secondary | ICD-10-CM | POA: Diagnosis not present

## 2017-03-30 DIAGNOSIS — R7303 Prediabetes: Secondary | ICD-10-CM | POA: Diagnosis not present

## 2017-03-30 MED ORDER — FEXOFENADINE HCL 180 MG PO TABS: 180.0000 mg | ORAL_TABLET | Freq: Every day | ORAL | Status: DC

## 2017-03-30 NOTE — Progress Notes (Signed)
   Subjective:    Patient ID: Jeffrey Stafford, male    DOB: 06-28-1951, 66 y.o.   MRN: [id number]  HPI   1.  Vertigo:  Past 2-3 days.  Has had this before.  Gets this maybe once per year.  Has noted with getting cold water in his ear.  Thinks he got water in his ear again before started--was out in the rain.  Looks like the room spins when he rolls over in bed either direction, or if bends over.  No problems with gait..  No numbness, tingling, or weakness anywhere.  2.  Essential Hypertension:  Taking Lisinopril 10 mg daily religiously.    3.  Hyperlipidemia/prediabetes:  Went over labs again from January.  He is to have repeat A1C mid May in follow up.  He is always active.  Trying to eat a lot of veggies and fruits.    4.  Out cutting down bushes yesterday and congested from all the pollen yesterday.    Current Meds  Medication Sig  . lisinopril (PRINIVIL,ZESTRIL) 10 MG tablet TAKE ONE TABLET BY MOUTH ONCE DAILY WITH MEALS   No Known Allergies    Review of Systems     Objective:   Physical Exam  Congested HEENT:  PERRL, EOMI TMs pearly gray, throat without injection, nasal mucosa swollen and boggy with clear discharge Neck: Supple, no adenopathy, no thyromegaly Chest:  CTA CV:  RRR with normal S1 and S2, No S3, S4 or murmur, radial and DP pulses normal and equal LE:  No edema Neuro:  A & O x 3, CN II-XII grossly intact, DTRs not obtainable--unable to relax.  Motor 5/5 throughout.  Mild intention tremor with right hand.  Finger to nose to finger WNL, rapid alternating motion normal, Romberg negative.  Gait normal.  Lucious Groves maneuver with symptoms only looking to left.  No nystagmus associated.  Symptoms resolved within 3 seconds.       Assessment & Plan:  1.  Vertigo:  Likely benign positional vertigo. Hopefully, will improve with Lucious Groves.  To call if continues or not improving  2.  Essential Hypertension:  Controlled.  Recheck BMP with previous borderline K+.  States  was recently having muscle cramping that resolved with drinking Pedialyte.  3.  Prediabetes:  To have A1C checked in 1 month.  Continue to work on diet and physical activity  4.  Hyperlipidemia:  Lifestyle changes as above.

## 2017-03-30 NOTE — Patient Instructions (Signed)
50:50 mix of rubbing alcohol and white vinegar -- 4 drops in ear after getting water in it, then drain out.

## 2017-03-31 LAB — BASIC METABOLIC PANEL
BUN/Creatinine Ratio: 11 (ref 10–24)
BUN: 15 mg/dL (ref 8–27)
CHLORIDE: 98 mmol/L (ref 96–106)
CO2: 22 mmol/L (ref 18–29)
Calcium: 9.2 mg/dL (ref 8.6–10.2)
Creatinine, Ser: 1.31 mg/dL — ABNORMAL HIGH (ref 0.76–1.27)
GFR calc non Af Amer: 57 mL/min/{1.73_m2} — ABNORMAL LOW (ref >59)
GFR, EST AFRICAN AMERICAN: 66 mL/min/{1.73_m2} (ref >59)
GLUCOSE: 101 mg/dL — AB (ref 65–99)
POTASSIUM: 4.9 mmol/L (ref 3.5–5.2)
Sodium: 136 mmol/L (ref 134–144)

## 2017-04-20 ENCOUNTER — Encounter: Payer: Self-pay | Admitting: Internal Medicine

## 2017-04-20 ENCOUNTER — Ambulatory Visit (INDEPENDENT_AMBULATORY_CARE_PROVIDER_SITE_OTHER): Payer: Medicare Other | Admitting: Internal Medicine

## 2017-04-20 VITALS — BP 102/68 | HR 78 | Temp 98.0°F | Resp 12 | Ht 66.25 in | Wt 164.0 lb

## 2017-04-20 DIAGNOSIS — R42 Dizziness and giddiness: Secondary | ICD-10-CM | POA: Diagnosis not present

## 2017-04-20 DIAGNOSIS — R252 Cramp and spasm: Secondary | ICD-10-CM

## 2017-04-20 MED ORDER — VITAMIN-B COMPLEX PO TABS: ORAL_TABLET | ORAL | 0 refills | Status: DC

## 2017-04-20 NOTE — Progress Notes (Signed)
   Subjective:    Patient ID: Jeffrey Stafford, male    DOB: 16-Jun-1951, 66 y.o.   MRN: [id number]  HPI   1.  Vertigo: Still having symptoms of vertigo every day.  Occurs every time he rolls over to left or has to turn his head to the left.  Lasts about 1 minute.  Does state the vertigo is less prominent.  2.  Muscular cramps of anterior abdominal wall and posterior thighs.  Tries mustard when it starts.   Used to use quinine and helped before removed from the market.  Has never tried tonic water.  Had thigh and abdominal cramps today, followed by diarrhea.  Sometimes has diarrhea, sometimes not.  K+, Ca++, and Na+  Last visit were normal.  Has had this problem for 6 years or so.  Does not seem related to Lisinopril usage as had had without use of the medication.  Can last up to 6 days in a row.  Current Meds  Medication Sig  . fexofenadine (ALLEGRA) 180 MG tablet Take 1 tablet (180 mg total) by mouth daily.  Marland Kitchen lisinopril (PRINIVIL,ZESTRIL) 10 MG tablet TAKE ONE TABLET BY MOUTH ONCE DAILY WITH MEALS    No Known Allergies    Review of Systems     Objective:   Physical Exam Jumps up at times during history and exam and shows me muscle cramping of anterior abdominal wall, chest. Otherwise, NAD Lungs:  CTA CV:  RRR without murmur or rub, radial pulses normal and equal Abd:  Muscle cramping as above, NT, No HSM or mass, + BS Neuro:  A & O x 3, CN II-XII intact, DTRs 2+/4 motor 5/5, finger to nose to finger, gait, Romberg all normal or negative. No nystagmus with EOMI, which are intact.       Assessment & Plan:  1.  Vertigo:  Encouraged to perform Lucious Groves maneuver at home to see if helps resolve the issue.  No abnormal neurologic findings.  2.  Muscle cramping:  To start B complex Vitamins three times daily.  Push fluids, particularly water.  Recent electrolytes fine.

## 2017-05-09 ENCOUNTER — Other Ambulatory Visit (INDEPENDENT_AMBULATORY_CARE_PROVIDER_SITE_OTHER): Payer: Medicare Other

## 2017-05-09 DIAGNOSIS — E782 Mixed hyperlipidemia: Secondary | ICD-10-CM

## 2017-05-09 DIAGNOSIS — R7303 Prediabetes: Secondary | ICD-10-CM

## 2017-05-10 LAB — LIPID PANEL W/O CHOL/HDL RATIO
CHOLESTEROL TOTAL: 153 mg/dL (ref 100–199)
HDL: 42 mg/dL (ref >39)
LDL Calculated: 85 mg/dL (ref 0–99)
TRIGLYCERIDES: 129 mg/dL (ref 0–149)
VLDL Cholesterol Cal: 26 mg/dL (ref 5–40)

## 2017-05-10 LAB — HGB A1C W/O EAG: Hgb A1c MFr Bld: 6.1 % — ABNORMAL HIGH (ref 4.8–5.6)

## 2017-09-30 ENCOUNTER — Encounter: Payer: Self-pay | Admitting: Internal Medicine

## 2017-09-30 ENCOUNTER — Ambulatory Visit (INDEPENDENT_AMBULATORY_CARE_PROVIDER_SITE_OTHER): Payer: Medicare Other | Admitting: Internal Medicine

## 2017-09-30 VITALS — BP 118/58 | HR 70 | Resp 14 | Ht 66.25 in | Wt 165.0 lb

## 2017-09-30 DIAGNOSIS — Z720 Tobacco use: Secondary | ICD-10-CM | POA: Diagnosis not present

## 2017-09-30 DIAGNOSIS — R7303 Prediabetes: Secondary | ICD-10-CM

## 2017-09-30 DIAGNOSIS — I10 Essential (primary) hypertension: Secondary | ICD-10-CM

## 2017-09-30 MED ORDER — NICOTINE 21 MG/24HR TD PT24: 21.0000 mg | MEDICATED_PATCH | Freq: Every day | TRANSDERMAL | 0 refills | Status: DC

## 2017-09-30 MED ORDER — NICOTINE 7 MG/24HR TD PT24: 7.0000 mg | MEDICATED_PATCH | Freq: Every day | TRANSDERMAL | 0 refills | Status: DC

## 2017-09-30 MED ORDER — NICOTINE 14 MG/24HR TD PT24: 14.0000 mg | MEDICATED_PATCH | Freq: Every day | TRANSDERMAL | 0 refills | Status: DC

## 2017-09-30 NOTE — Progress Notes (Signed)
   Subjective:    Patient ID: Jeffrey Stafford, male    DOB: [birth year], 66 y.o.   MRN: [id number]  HPI   1.  Essential Hypertension:  Was rushing to get here today and feels that was the reason his bp is up. No dyspnea with exertion, no CP, No PND or orthopnea, no LE edema.  2.  Muscle cramps:  Has used Ivory soap in the bed, but is under the mattress.  Discussed should be either under the fitted sheet at the foot or in between the two sheets.  The Vitamin B complex he takes only once daily.  Feels this is much better controlled.  3.  Tobacco Use:  Smoking 1ppd.  Has never really tried to quit before.  He lives with his mother and she does not smoke.  Not clear he is interested in stopping.  4.  Prediabetes:  Feels he is eating in a healthy manner and is physically active on a daily basis.  Current Meds  Medication Sig  . B Complex Vitamins (VITAMIN-B COMPLEX) TABS Should contain 30 mg of 6.  1 tab three times daily  . fexofenadine (ALLEGRA) 180 MG tablet Take 1 tablet (180 mg total) by mouth daily.  Marland Kitchen lisinopril (PRINIVIL,ZESTRIL) 10 MG tablet TAKE ONE TABLET BY MOUTH ONCE DAILY WITH MEALS   No Known Allergies    Review of Systems     Objective:   Physical Exam  NAD LUngs:  CTA CV:  RRR without murmur or rub, radial and DP pulses normal and equal LE: No edema        Assessment & Plan:  1.  Essential Hypertension:  On recheck, BP is excellent after sitting for a bit.  CPM  2.  Prediabetes:  Has maintained weight and is continuing to work on lifestyle changes.  3.  Tobacco Use:  Encouraged discontinuation.  Discussed use of Nicotine patches and support services available.  F/U in 2 months to see how he is doing with this.  4.  HM:  Encouraged to get influenza vaccine at [facility] or call back here for when vaccine order comes in. Encouraged to come for oral cancer screen and gratis PSA on October 18.  infor given

## 2017-09-30 NOTE — Patient Instructions (Addendum)
Please come to orange card sign up on October 18th from 8:30 a.m. To 11:30 a.m. For a free oral cancer screen and PSA testing  Call for influenza vaccine in a couple of weeks or go to [facility]  Tobacco Cessation:   [id number] or [phone], the former for support and possibly free nicotine patches/gum and support; the latter for [facility] Smoking cessation class. Get rid of all smoking supplies:  Cigarettes, lighters, ashtrays--no stashes just in case at home if you are serious.  For nicotine patches:  Stop smoking anything the day you start the first patch Start with 21 mg patch and reapply new to different area of skin every 24 hours for 30 days. Then 14 mg patch changed every 24 hours for 14 days. Then 7 mg patch changed every 24 hours for 14 days.

## 2017-12-02 ENCOUNTER — Ambulatory Visit: Payer: Medicare Other | Admitting: Internal Medicine

## 2017-12-02 ENCOUNTER — Encounter: Payer: Self-pay | Admitting: Internal Medicine

## 2017-12-02 VITALS — BP 120/80 | HR 76 | Resp 12 | Ht 66.25 in | Wt 172.0 lb

## 2017-12-02 DIAGNOSIS — Z720 Tobacco use: Secondary | ICD-10-CM | POA: Diagnosis not present

## 2017-12-02 DIAGNOSIS — Z23 Encounter for immunization: Secondary | ICD-10-CM

## 2017-12-02 DIAGNOSIS — R252 Cramp and spasm: Secondary | ICD-10-CM | POA: Diagnosis not present

## 2017-12-02 DIAGNOSIS — I10 Essential (primary) hypertension: Secondary | ICD-10-CM

## 2017-12-02 DIAGNOSIS — Z9109 Other allergy status, other than to drugs and biological substances: Secondary | ICD-10-CM | POA: Insufficient documentation

## 2017-12-02 DIAGNOSIS — J3089 Other allergic rhinitis: Secondary | ICD-10-CM

## 2017-12-02 NOTE — Progress Notes (Signed)
   Subjective:    Patient ID: Jeffrey Stafford, male    DOB: [birth year], 66 y.o.   MRN: [id number]  HPI   1.  Tobacco Abuse:  Has not made attempt to quit yet. Discussed benefits again.  Not clear he has any interest.  Still has coupons and Rx for nicotine patches.  Has information I gave him on how to use the patches.    2.  HM:  Did not get influenza vaccine at [facility].  3.  Muscle cramps:  Moved Ivory soap to under the sheet.  Only taking Vitamin B complex still once daily.  Much better now.   4.  Hypertension:  Taking meds.  No problems  Current Meds  Medication Sig  . B Complex Vitamins (VITAMIN-B COMPLEX) TABS Should contain 30 mg of 6.  1 tab three times daily  . Cholecalciferol (VITAMIN D PO) Take by mouth daily.  Marland Kitchen. lisinopril (PRINIVIL,ZESTRIL) 10 MG tablet TAKE ONE TABLET BY MOUTH ONCE DAILY WITH MEALS    No Known Allergies    Review of Systems     Objective:   Physical Exam   NAD HEENT:  PERRL, EOMI, clear watery eyes, TMs pearly gray.  Nasal mucosa swollen and boggy, clear discharge.  Throat without injection. Neck:  Supple, No adenopathy Chest:  CTA CV:  RRR with normal S1 and S2, No S3, S4 or murmur.  Radial and DP pulses normal and equal. LE:  No edema        Assessment & Plan:  1.  Tobacco abuse:  Did not initiate treatment or attempt quitting as discussed at last visit.   Encouraged him to get started.   He does not seem interested at this point. He has all of the information still at home.  2.  Hypertension:  Controlled.   CPM  3.  Allergies:  Moisture and leaves on ground currently.  Encouraged him to restart Fexofenadine 180 mg daily.  4.  HM:  Influenza vaccine.  5.  Muscle cramps:  Controlled with B complex and soap under sheets.  CPE in 6 months.

## 2017-12-02 NOTE — Patient Instructions (Signed)
Get started back on your Allegra. Call if your congestion or cough worsen or you develop shortness of breat Get started on Nicotine patches and quit smoking.

## 2017-12-09 ENCOUNTER — Telehealth: Payer: Self-pay | Admitting: Internal Medicine

## 2017-12-09 NOTE — Telephone Encounter (Signed)
Patient had appointment on December 7th, received the flu vaccine and had reaction.  Patient had night sweats and chills that were severe.  Patient had loose stools, not wanting to eat, fever (slight).  Patient had hernia surgery, lower right testicle December about this time last year and it seems like hernia is coming back on same side - has a mass.  Patient can be contacted at [phone].

## 2017-12-12 NOTE — Telephone Encounter (Signed)
Please schedule appointment for patient

## 2017-12-13 ENCOUNTER — Ambulatory Visit (INDEPENDENT_AMBULATORY_CARE_PROVIDER_SITE_OTHER): Payer: Medicare Other | Admitting: Internal Medicine

## 2017-12-13 ENCOUNTER — Encounter: Payer: Self-pay | Admitting: Internal Medicine

## 2017-12-13 VITALS — BP 150/70 | HR 74 | Resp 12 | Ht 66.25 in | Wt 164.0 lb

## 2017-12-13 DIAGNOSIS — J3089 Other allergic rhinitis: Secondary | ICD-10-CM

## 2017-12-13 DIAGNOSIS — N433 Hydrocele, unspecified: Secondary | ICD-10-CM | POA: Diagnosis not present

## 2017-12-13 DIAGNOSIS — K529 Noninfective gastroenteritis and colitis, unspecified: Secondary | ICD-10-CM

## 2017-12-13 NOTE — Progress Notes (Signed)
   Subjective:    Patient ID: Jeffrey Stafford, male    DOB: 01/04/1951, 66 y.o.   MRN: [id number]  HPI   The evening of 12.7.2018 when last here, developed severe "gut twisting" pain in his lower abdomen bilaterally.   Had problems for 1 week.   Pain would ease up and then return.  Did have diarrhea with this.  No blood.  Did have nausea and vomiting with this.   Sipped on water with baking soda as well a Pedialyte.   Last episode of vomiting was 2 days ago.   Did feel feverish.  Cannot recall last time felt this way. Has had similar illness previously.   Never stopped urinating.  Now is fine.   He does relate his problem to his recurrent problems with muscle spasms of abdominal wall.   He is also worried this may have something to do with recurrence of hernia (actually had a hydrocele)  He has noted over past 2 weeks that he has an enlargement of his right scrotum and hard to walk and cross legs again.  May have enlarged while he was ill, cannot say if any enlargement prior.  Allergies:  Did get restarted on Allegra--has helped.  Current Meds  Medication Sig  . lisinopril (PRINIVIL,ZESTRIL) 10 MG tablet TAKE ONE TABLET BY MOUTH ONCE DAILY WITH MEALS    No Known Allergies    Review of Systems     Objective:   Physical Exam   NAD HEENT: PERRL, EOMI, throat without injection.  MMM Neck:  Supple, No adenopathy Chest:  CTA CV:  RRR without murmur or rub.  Radial pulses normal and equal Abd:  S, NT, No HSM, No mass, + BS GU:  Right testicular area with almost baseball sized fluid filled mass.  Unable to reduce or feel hernial opening in inguinal area.  NT     Assessment & Plan:  1.  Likely Gastroenteritis, from which he had recovered.  Check BMP with fluid losses previously. Discussed do not believe his GI symptoms caused by the hydrocele he feels is a hernia or due to his muscle spasms. Discussed muscle spasms may have recurred due to electrolyte imbalance with nausea and  vomiting or dehydration.  2.  Right Hydrocele:  Refer again to Urology.  3.  Allergies:  Improved with addition of Allegra.

## 2017-12-14 LAB — BASIC METABOLIC PANEL
BUN / CREAT RATIO: 11 (ref 10–24)
BUN: 14 mg/dL (ref 8–27)
CALCIUM: 9.4 mg/dL (ref 8.6–10.2)
CHLORIDE: 102 mmol/L (ref 96–106)
CO2: 23 mmol/L (ref 20–29)
Creatinine, Ser: 1.32 mg/dL — ABNORMAL HIGH (ref 0.76–1.27)
GFR, EST AFRICAN AMERICAN: 65 mL/min/{1.73_m2} (ref >59)
GFR, EST NON AFRICAN AMERICAN: 56 mL/min/{1.73_m2} — AB (ref >59)
Glucose: 87 mg/dL (ref 65–99)
Potassium: 5.2 mmol/L (ref 3.5–5.2)
Sodium: 138 mmol/L (ref 134–144)

## 2018-01-26 NOTE — Progress Notes (Signed)
Referral OV notes and insurance and demographics faxed to Dr. Emmaline LifeManny's office. Patient has appointment scheduled for 01/31/18 @ 2:30 pm with Dr. Berneice HeinrichManny. Patient has been informed and verbalized understanding

## 2018-03-08 ENCOUNTER — Other Ambulatory Visit: Payer: Self-pay | Admitting: Urology

## 2018-03-14 ENCOUNTER — Encounter (HOSPITAL_BASED_OUTPATIENT_CLINIC_OR_DEPARTMENT_OTHER): Payer: Self-pay | Admitting: *Deleted

## 2018-03-22 ENCOUNTER — Ambulatory Visit (HOSPITAL_BASED_OUTPATIENT_CLINIC_OR_DEPARTMENT_OTHER): Admit: 2018-03-22 | Payer: Self-pay | Admitting: Urology

## 2018-03-22 SURGERY — HYDROCELECTOMY
Anesthesia: General | Laterality: Right

## 2018-03-29 ENCOUNTER — Other Ambulatory Visit: Payer: Self-pay | Admitting: Internal Medicine

## 2018-06-02 ENCOUNTER — Encounter: Payer: Medicaid Other | Admitting: Internal Medicine

## 2018-06-23 ENCOUNTER — Encounter: Payer: Self-pay | Admitting: Internal Medicine

## 2018-06-23 ENCOUNTER — Ambulatory Visit: Payer: Medicare Other | Admitting: Internal Medicine

## 2018-06-23 VITALS — BP 130/68 | HR 62 | Resp 12 | Ht 66.25 in | Wt 170.0 lb

## 2018-06-23 DIAGNOSIS — Z720 Tobacco use: Secondary | ICD-10-CM | POA: Diagnosis not present

## 2018-06-23 DIAGNOSIS — F439 Reaction to severe stress, unspecified: Secondary | ICD-10-CM | POA: Diagnosis not present

## 2018-06-23 DIAGNOSIS — Z0000 Encounter for general adult medical examination without abnormal findings: Secondary | ICD-10-CM | POA: Diagnosis not present

## 2018-06-23 DIAGNOSIS — E782 Mixed hyperlipidemia: Secondary | ICD-10-CM

## 2018-06-23 DIAGNOSIS — R7303 Prediabetes: Secondary | ICD-10-CM

## 2018-06-23 NOTE — Patient Instructions (Signed)
Can google "advance directives, [place]"  And bring up form from Secretary of State. Print and fill out Or can go to "5 wishes"  Which is also in Spanish and fill out--this costs $5--perhaps easier to use. Designate a Medical Power of Attorney to speak for you if you are unable to speak for yourself when ill or injured  

## 2018-06-23 NOTE — Progress Notes (Signed)
Subjective:    Patient ID: Jeffrey Stafford, male    DOB: 1951/03/15, 67 y.o.   MRN: [id number]  HPI   Here for Male CPE:  1.  STE:  Yes, in shower regularly.  No family history of testicular cancer.  He has a right hydrocele for which he and Urology decided to hold on surgery to correct.  Had testicular exam in March.    2.  PSA/DRE:  He believes urology did PSA in March.  He did have a DRE, last in March.  Was told everything was okay.  3.  Guaiac Cards:  Never.  No family history of colon cancer.  4.  Colonoscopy: Never.  5.  Cholesterol/Glucose:  Hyperlipidemia and Prediabetes.  Both are due for check, though he is not fasting today.  6.  Immunizations:  Needs Pneumococcal vaccine, both 13 and 23.  Current Meds  Medication Sig  . lisinopril (PRINIVIL,ZESTRIL) 10 MG tablet TAKE ONE TABLET BY MOUTH ONCE DAILY WITH MEALS  . Multiple Vitamin (MULTIVITAMIN) tablet Take 1 tablet by mouth daily.  . [DISCONTINUED] B Complex Vitamins (VITAMIN-B COMPLEX) TABS Should contain 30 mg of 6.  1 tab three times daily    No Known Allergies   Past Medical History:  Diagnosis Date  . Cocaine substance abuse (HCC) 2014-2015  . Eczema   . Hyperlipidemia   . Hypertension   . Nocturia   . Prediabetes    followed by pcp-- last A1c 6.1 on 05-09-2017 in epic  . Right hydrocele     Past Surgical History:  Procedure Laterality Date  . HYDROCELE EXCISION Right 12/05/2015   Procedure: RIGHT HYDROCELECTOMY ADULT;  Surgeon: Sebastian Ache, MD;  Location: [facility];  Service: Urology;  Laterality: Right;  . ORCHIOPEXY Right 12/05/2015   Procedure: ORCHIOPEXY/ SPERMATOCELECTOMY  ADULT;  Surgeon: Sebastian Ache, MD;  Location: [facility];  Service: Urology;  Laterality: Right;     Social History   Socioeconomic History  . Marital status: Single    Spouse name: Not on file  . Number of children: 0  . Years of education: Not on file  . Highest education level:  Associate degree: occupational, Scientist, product/process development, or vocational program  Occupational History  . Occupation: retired    Comment: Research scientist (physical sciences)  . Financial resource strain: Not very hard  . Food insecurity:    Worry: Never true    Inability: Never true  . Transportation needs:    Medical: No    Non-medical: No  Tobacco Use  . Smoking status: Current Every Day Smoker    Packs/day: 0.50    Years: 35.00    Pack years: 17.50    Types: Cigarettes  . Smokeless tobacco: Never Used  . Tobacco comment: referral to quit line as not able to get him motivated previously  Substance and Sexual Activity  . Alcohol use: No  . Drug use: Yes    Types: Cocaine    Comment: No use for 1 year  . Sexual activity: Not Currently  Lifestyle  . Physical activity:    Days per week: 7 days    Minutes per session: 120 min  . Stress: To some extent  Relationships  . Social connections:    Talks on phone: More than three times a week    Gets together: More than three times a week    Attends religious service: Never    Active member of club or organization: Yes  Attends meetings of clubs or organizations: More than 4 times per year    Relationship status: Never married  . Intimate partner violence:    Fear of current or ex partner: No    Emotionally abused: No    Physically abused: No    Forced sexual activity: No  Other Topics Concern  . Not on file  Social History Narrative   Lives with his mother in [place] neighborhood.   He is retired, but very active, helping friends and tinkering on cars.   Going to [employer].  States he was at 6.4% with beginning of class this year.    Family History  Problem Relation Age of Onset  . Diabetes Mother   . Heart disease Mother        Valvular Cardiomyopathy  . Pulmonary embolism Father        complication of amputation of leg for gangrene  . Peripheral vascular disease Father   . Alcohol abuse Father   . Cirrhosis  Brother        alcoholic--had shunt  . Lung cancer Brother        had removal of spot from lung  . Alcohol abuse Brother        after death of son  . Drug abuse Brother        Following death of son  . Kidney disease Brother        not clear of cause:  exposure to something [place] in [place] in [place].  Peritoneal dialysis.  developed skin infection around catheter and died     Review of Systems  Constitutional: Negative for appetite change, fatigue, fever and unexpected weight change.  HENT: Negative for dental problem, ear pain, hearing loss, rhinorrhea, sinus pressure, sore throat, trouble swallowing and voice change.   Eyes: Positive for visual disturbance (Has appt with Dr. Dione BoozeGroat next Tuesday.  Does not currently wear wear glasses.).  Respiratory: Negative for cough and shortness of breath.   Cardiovascular: Negative for chest pain, palpitations and leg swelling.  Gastrointestinal: Negative for abdominal pain, blood in stool (no melena), constipation and diarrhea.  Genitourinary: Negative for dysuria.       Nocturia 3-4 times nightly, but drinks a lot of water during the night.  No decreased flow or hesistancy  Musculoskeletal:       Right shoulder and left wrist with stiffness at times.  Used something like Aspercreme and that takes care of the problem  Skin: Negative for rash.  Neurological: Negative for dizziness, weakness, numbness and headaches.  Hematological: Negative for adenopathy. Does not bruise/bleed easily.  Psychiatric/Behavioral: Positive for dysphoric mood. The patient is not nervous/anxious.        Objective:   Physical Exam  Constitutional: He is oriented to person, place, and time. He appears well-developed and well-nourished.  HENT:  Head: Normocephalic and atraumatic.  Right Ear: Hearing, tympanic membrane, external ear and ear canal normal.  Left Ear: Hearing, tympanic membrane, external ear and ear canal normal.  Nose: Nose normal.  Mouth/Throat:  Uvula is midline, oropharynx is clear and moist and mucous membranes are normal.  Eyes: Pupils are equal, round, and reactive to light. Conjunctivae and EOM are normal.  Discs sharp bilaterally  Neck: Normal range of motion and full passive range of motion without pain. Neck supple. No thyromegaly present.  Cardiovascular: Normal rate, regular rhythm, S1 normal and S2 normal. Exam reveals no S3 and no friction rub.  No murmur heard. No carotid  bruits.  Carotid, radial, femoral, DP and PT pulses normal and equal.   Pulmonary/Chest: Effort normal and breath sounds normal.  Abdominal: Soft. Bowel sounds are normal. He exhibits no mass. There is no hepatosplenomegaly. There is no tenderness. No hernia.  Genitourinary:  Genitourinary Comments: Deferred as had GU exam with Urology in March of this year.  Musculoskeletal: Normal range of motion.  Lymphadenopathy:       Head (right side): No submental and no submandibular adenopathy present.       Head (left side): No submental and no submandibular adenopathy present.    He has no cervical adenopathy.    He has no axillary adenopathy.       Right: No inguinal and no supraclavicular adenopathy present.       Left: No inguinal and no supraclavicular adenopathy present.  Neurological: He is alert and oriented to person, place, and time. He has normal strength and normal reflexes. No cranial nerve deficit or sensory deficit. He exhibits normal muscle tone. Coordination and gait normal.  Skin: Skin is warm. Capillary refill takes less than 2 seconds. No rash noted.  Psychiatric: He has a normal mood and affect. His speech is normal and behavior is normal. Judgment and thought content normal. Cognition and memory are normal.        Assessment & Plan:  1.  CPE Guaiac cards to return in 2 weeks. Check on labs and report from Urology  2.  Tobacco Abuse:  Encouraged trying nicotine patches again.  quitline information given.    3.   Prediabetes/hyperlipidemia:  Fasting labs in 1 week.  4.  Hypertension:  controlled

## 2018-06-30 ENCOUNTER — Telehealth: Payer: Self-pay | Admitting: Licensed Clinical Social Worker

## 2018-06-30 NOTE — Telephone Encounter (Signed)
LCSW called patient regarding counseling referral from Dr. Delrae AlfredMulberry; could not leave voicemail.

## 2018-07-07 ENCOUNTER — Other Ambulatory Visit (INDEPENDENT_AMBULATORY_CARE_PROVIDER_SITE_OTHER): Payer: Medicare Other

## 2018-07-07 DIAGNOSIS — E782 Mixed hyperlipidemia: Secondary | ICD-10-CM

## 2018-07-07 DIAGNOSIS — Z79899 Other long term (current) drug therapy: Secondary | ICD-10-CM

## 2018-07-07 DIAGNOSIS — R7303 Prediabetes: Secondary | ICD-10-CM

## 2018-07-08 LAB — CBC WITH DIFFERENTIAL/PLATELET
BASOS ABS: 0.1 10*3/uL (ref 0.0–0.2)
Basos: 1 %
EOS (ABSOLUTE): 0.1 10*3/uL (ref 0.0–0.4)
Eos: 1 %
HEMOGLOBIN: 13.2 g/dL (ref 13.0–17.7)
Hematocrit: 41.2 % (ref 37.5–51.0)
IMMATURE GRANS (ABS): 0 10*3/uL (ref 0.0–0.1)
IMMATURE GRANULOCYTES: 0 %
LYMPHS: 21 %
Lymphocytes Absolute: 2.2 10*3/uL (ref 0.7–3.1)
MCH: 28 pg (ref 26.6–33.0)
MCHC: 32 g/dL (ref 31.5–35.7)
MCV: 87 fL (ref 79–97)
MONOCYTES: 7 %
Monocytes Absolute: 0.7 10*3/uL (ref 0.1–0.9)
NEUTROS PCT: 70 %
Neutrophils Absolute: 7.3 10*3/uL — ABNORMAL HIGH (ref 1.4–7.0)
Platelets: 316 10*3/uL (ref 150–450)
RBC: 4.72 x10E6/uL (ref 4.14–5.80)
RDW: 13.6 % (ref 12.3–15.4)
WBC: 10.4 10*3/uL (ref 3.4–10.8)

## 2018-07-08 LAB — HGB A1C W/O EAG: HEMOGLOBIN A1C: 6.2 % — AB (ref 4.8–5.6)

## 2018-07-08 LAB — LIPID PANEL W/O CHOL/HDL RATIO
CHOLESTEROL TOTAL: 194 mg/dL (ref 100–199)
HDL: 50 mg/dL (ref >39)
LDL CALC: 118 mg/dL — AB (ref 0–99)
Triglycerides: 131 mg/dL (ref 0–149)
VLDL Cholesterol Cal: 26 mg/dL (ref 5–40)

## 2018-07-08 LAB — COMPREHENSIVE METABOLIC PANEL
ALBUMIN: 4.3 g/dL (ref 3.6–4.8)
ALK PHOS: 121 IU/L — AB (ref 39–117)
ALT: 15 IU/L (ref 0–44)
AST: 21 IU/L (ref 0–40)
Albumin/Globulin Ratio: 1.5 (ref 1.2–2.2)
BUN / CREAT RATIO: 10 (ref 10–24)
BUN: 10 mg/dL (ref 8–27)
Bilirubin Total: 0.6 mg/dL (ref 0.0–1.2)
CO2: 22 mmol/L (ref 20–29)
CREATININE: 1 mg/dL (ref 0.76–1.27)
Calcium: 9.6 mg/dL (ref 8.6–10.2)
Chloride: 101 mmol/L (ref 96–106)
GFR calc Af Amer: 90 mL/min/{1.73_m2} (ref >59)
GFR calc non Af Amer: 78 mL/min/{1.73_m2} (ref >59)
GLOBULIN, TOTAL: 2.9 g/dL (ref 1.5–4.5)
Glucose: 112 mg/dL — ABNORMAL HIGH (ref 65–99)
Potassium: 4.9 mmol/L (ref 3.5–5.2)
SODIUM: 137 mmol/L (ref 134–144)
Total Protein: 7.2 g/dL (ref 6.0–8.5)

## 2018-08-01 ENCOUNTER — Ambulatory Visit: Payer: Medicare Other | Admitting: Licensed Clinical Social Worker

## 2018-08-01 DIAGNOSIS — F329 Major depressive disorder, single episode, unspecified: Secondary | ICD-10-CM | POA: Diagnosis not present

## 2018-08-01 DIAGNOSIS — F32A Depression, unspecified: Secondary | ICD-10-CM

## 2018-08-03 NOTE — Progress Notes (Signed)
   THERAPY PROGRESS NOTE  Session Time: 60min  Participation Level: Active  Behavioral Response: CasualAlertEuthymic  Type of Therapy: Individual Therapy  Treatment Goals addressed: Coping  Interventions: Strength-based and Supportive  Summary: Jeffrey Stafford is a 67 y.o. male who presents with a euthymic mood and appropriate affect. He reported that he was seeking counseling based on Dr. Renne CriglerMulberry's recommendation but also recognizes that he has struggled with depression for many years. He shared that he lives with his elderly mother and is the main caretaker for her. He shared about several deaths in his family, including his father in [id number], his younger brother in [id number], his older brother less than 10 years ago, and his best friend less than a year ago. He endorsed depressive symptoms including feeling down/depressed, anhedonia, sleep disturbance, feelings of worthlessness, and fatigue. He endorsed anxious symptoms including excessive worries, restlessness, difficulty relaxing, irritability, and feelings of doom or dread. He denied any suicidal thoughts. Jeffrey Stafford reported that he has smoked cigarettes for a long time and realizes that he needs to quit, but feels that he is not ready for that right now.   Suicidal/Homicidal: Nowithout intent/plan  Therapist Response: LCSW began the clinical assessment but was unable to finish due to time constraints. LCSW utilized supportive counseling techniques throughout the session in order to validate emotions and encourage open expression of emotion. LCSW and Jeffrey Stafford processed about current mental health symptoms and stressors.  Plan: Return again in 1 weeks.   Jeffrey Simmeratosha Timiko Offutt, LCSW 08/03/2018

## 2018-08-08 ENCOUNTER — Ambulatory Visit: Payer: Medicare Other | Admitting: Licensed Clinical Social Worker

## 2018-08-08 DIAGNOSIS — F339 Major depressive disorder, recurrent, unspecified: Secondary | ICD-10-CM
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
## 2018-08-22 ENCOUNTER — Ambulatory Visit (INDEPENDENT_AMBULATORY_CARE_PROVIDER_SITE_OTHER): Payer: Medicare Other | Admitting: Licensed Clinical Social Worker

## 2018-08-22 DIAGNOSIS — F339 Major depressive disorder, recurrent, unspecified: Secondary | ICD-10-CM

## 2018-08-29 NOTE — Progress Notes (Signed)
   THERAPY PROGRESS NOTE  Session Time:  Participation Level: Active  Behavioral Response: CasualAlertEuthymic  Type of Therapy: Individual Therapy  Treatment Goals addressed: Coping  Interventions: Strength-based and Supportive  Summary: Jeffrey Stafford is a 67 y.o. male who presents with a euthymic mood and appropriate affect. He shared that he has been feeling okay lately. He shared about his current level of motivation to work on quitting smoking, which is low. He stated that he needs to "work on it in his own time." He shared that he knows it isn't good for him but that he does not yet feel that it impacts his health, so it's not a priority. Jeffrey Stafford shared about some past events that have impacted his life, especially deaths of those close to him. He shared about his friendship with his best friend who died less than a year ago. He shared about what the friendship meant to him and how his life has been since his friend's death. He shared that he feels he is coping well with the loss but that it is still difficult.   Suicidal/Homicidal: Nowithout intent/plan  Therapist Response: LCSW utilized supportive counseling techniques throughout the session in order to validate emotions and encourage open expression of emotion. LCSW checked in regarding motivation level to quit smoking. LCSW attempted to develop a discrepancy between his attitude towards smoking and attitude towards preventing diabetes. LCSW and Jeffrey Stafford processed about his grief and how it affects his life.  Plan: Return again in 2 weeks.    Nilda Simmer, LCSW 08/29/2018

## 2018-09-05 ENCOUNTER — Ambulatory Visit: Payer: Medicare Other | Admitting: Licensed Clinical Social Worker

## 2018-09-05 DIAGNOSIS — F339 Major depressive disorder, recurrent, unspecified: Secondary | ICD-10-CM
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
## 2018-09-19 ENCOUNTER — Ambulatory Visit: Payer: Medicare Other | Admitting: Licensed Clinical Social Worker

## 2018-09-19 DIAGNOSIS — F339 Major depressive disorder, recurrent, unspecified: Secondary | ICD-10-CM | POA: Diagnosis not present

## 2018-09-19 DIAGNOSIS — Z720 Tobacco use: Secondary | ICD-10-CM | POA: Diagnosis not present

## 2018-09-22 NOTE — Progress Notes (Signed)
   THERAPY PROGRESS NOTE  Session Time:  Participation Level: Active  Behavioral Response: CasualAlertEuthymic  Type of Therapy: Individual Therapy  Treatment Goals addressed: Coping  Interventions: Motivational Interviewing and Supportive  Summary: Jeffrey Stafford is a 67 y.o. male who presents with a euthymic mood and appropriate affect. He reported that he was doing pretty well despite ongoing sleep problems. He shared that he falls asleep okay in the late evening but that he gets up in the night to go to the bathroom. He is using TV or radio as noise to fall asleep, but was receptive to LCSW feedback about using a fan or noise machine so that it's white noise. He shared that he is feeling great about his weight loss in the pre-diabetes program. He again emphasized that he is slightly interested in quitting smoking but will only do it when he is ready and "on his own terms." He was able to recognize that his attitude towards smoking is significantly different from his attitude towards pre-diabetes (ie, working hard to avoid diabetes by being very proactive).     Suicidal/Homicidal: Nowithout intent/plan  Therapist Response: LCSW utilized supportive counseling techniques throughout the session in order to validate emotions and encourage open expression of emotion. LCSW used Motivational Interviewing to develop a discrepancy with [name] about his stated goals and current behavior (smoking). LCSW reflected on the hard work he is putting in towards eating health and drinking clean water, and then his continued smoking.  Plan: Return again in 2 weeks.    Nilda Simmer, LCSW 09/22/2018

## 2018-10-03 ENCOUNTER — Ambulatory Visit: Payer: Medicare Other | Admitting: Licensed Clinical Social Worker

## 2018-10-03 DIAGNOSIS — F339 Major depressive disorder, recurrent, unspecified: Secondary | ICD-10-CM

## 2018-10-06 NOTE — Progress Notes (Signed)
   THERAPY PROGRESS NOTE  Session Time:  Participation Level: Active  Behavioral Response: CasualAlertEuthymic  Type of Therapy: Individual Therapy  Treatment Goals addressed: Coping  Interventions: Strength-based and Supportive  Summary: Jeffrey Stafford is a 67 y.o. male who presents with a euthymic mood and appropriate affect. He reported that he has been having some sinus issues with the change in weather but that overall he is doing well. He shared that he tries to stay busy with helping friends with projects; he stated that these friends also help him when needed. He shared about his many connections in the neighborhood and the changes he has seen over the years. [name] shared about his life course, in that he did not have kids and does not currently have a partner. He expressed that he did not feel strongly about having kids -- he was open to them if it happened, but does not have any negative emotions around not having them. He shared that he is also very content without a partner. [name] completed a PHQ-9 and scored a 3, indicating mild to minimal current depressive symptoms.     Suicidal/Homicidal: Nowithout intent/plan  Therapist Response: LCSW utilized supportive counseling techniques throughout the session in order to validate emotions and encourage open expression of emotion. LCSW administered a PHQ-9 in order to assess the level of current depressive symptoms. LCSW and [name] processed about his life course and how he feels about it.  Plan: Return again in 2 weeks.    Nilda Simmer, LCSW 10/06/2018

## 2018-10-16 ENCOUNTER — Ambulatory Visit: Payer: Medicare Other | Admitting: Internal Medicine

## 2018-10-16 ENCOUNTER — Encounter: Payer: Self-pay | Admitting: Internal Medicine

## 2018-10-16 VITALS — BP 122/72 | HR 120 | Resp 12 | Ht 66.25 in | Wt 158.0 lb

## 2018-10-16 DIAGNOSIS — E860 Dehydration: Secondary | ICD-10-CM

## 2018-10-16 DIAGNOSIS — R220 Localized swelling, mass and lump, head: Secondary | ICD-10-CM | POA: Diagnosis not present

## 2018-10-16 LAB — POCT URINALYSIS DIPSTICK
Glucose, UA: NEGATIVE
Leukocytes, UA: NEGATIVE
Nitrite, UA: NEGATIVE
Protein, UA: POSITIVE — AB
Spec Grav, UA: 1.02 (ref 1.010–1.025)
Urobilinogen, UA: 0.2 E.U./dL
pH, UA: 6 (ref 5.0–8.0)

## 2018-10-16 NOTE — Progress Notes (Signed)
   Subjective:    Patient ID: Jeffrey Stafford, male    DOB: [birth year], 67 y.o.   MRN: [id number]  HPI  1.  Tongue swelling for 2 weeks.  States he developed an abscess or some sort of lump under his right tongue and it burst, squirting fluid into his mouth.  He describes something like saliva coming out of the lump.   He also had nausea, vomiting and diarrhea.  He was very tired and achy.  Had sweats and felt he had a fever.   His mother and his cousin apparently have had angioedema in the past, so is very concerned about that.   He stopped lisinopril 4 days ago. He was unable even to walk with this. GI symptoms subsided yesterday.  He has been able to drink fluids the last 2 days.   He is urinating.  Not clear if urine is dark.    No outpatient medications have been marked as taking for the 10/16/18 encounter (Office Visit) with Julieanne Manson, MD.   No Known Allergies  Review of Systems     Objective:   Physical Exam Appears fatigued, but not acutely ill HEENT:  PERRL, EOMI, mouth dry with tongue papillae prominent.  No current obvious swelling of tongue.  No elevation or swelling of subungual area.  No erythema in mouth. Neck:  Supple, no adenopathy. Chest:  CTA CV:  RRR  Without murmur or rub.  Radial and DP pulses normal and equal.  Currently without tachycardia after sitting for a bit. Abd:  S, NT, No HSM or mass, + BS LE:  No edema.       Assessment & Plan:  1.  Dehydration:  Not clear if this was angioedema, possibly even affecting GI tract as we.   Will not restart Lisinopril, but switch to something else once rehydrated. Checking BMP.  2.  Possible angioedema vs Viral syndrome with gastroenteritis:  Will discontinue Lisinopril as above.    3.  Hypertension:  Will hold on medication start today until rehydrated.  Return for bp and pulse check this Thursday, Oct. 24th.

## 2018-10-17 ENCOUNTER — Other Ambulatory Visit: Payer: Medicare Other | Admitting: Licensed Clinical Social Worker

## 2018-10-17 LAB — COMPREHENSIVE METABOLIC PANEL
ALT: 19 IU/L (ref 0–44)
AST: 24 IU/L (ref 0–40)
Albumin/Globulin Ratio: 1.6 (ref 1.2–2.2)
Albumin: 4.6 g/dL (ref 3.6–4.8)
Alkaline Phosphatase: 138 IU/L — ABNORMAL HIGH (ref 39–117)
BUN / CREAT RATIO: 24 (ref 10–24)
BUN: 28 mg/dL — AB (ref 8–27)
Bilirubin Total: 1.5 mg/dL — ABNORMAL HIGH (ref 0.0–1.2)
CO2: 21 mmol/L (ref 20–29)
CREATININE: 1.19 mg/dL (ref 0.76–1.27)
Calcium: 9.8 mg/dL (ref 8.6–10.2)
Chloride: 94 mmol/L — ABNORMAL LOW (ref 96–106)
GFR, EST AFRICAN AMERICAN: 73 mL/min/{1.73_m2} (ref >59)
GFR, EST NON AFRICAN AMERICAN: 63 mL/min/{1.73_m2} (ref >59)
GLUCOSE: 89 mg/dL (ref 65–99)
Globulin, Total: 2.9 g/dL (ref 1.5–4.5)
Potassium: 4.7 mmol/L (ref 3.5–5.2)
Sodium: 132 mmol/L — ABNORMAL LOW (ref 134–144)
TOTAL PROTEIN: 7.5 g/dL (ref 6.0–8.5)

## 2018-10-17 LAB — CBC WITH DIFFERENTIAL/PLATELET
BASOS: 0 %
Basophils Absolute: 0.1 10*3/uL (ref 0.0–0.2)
EOS (ABSOLUTE): 0.7 10*3/uL — AB (ref 0.0–0.4)
Eos: 5 %
Hematocrit: 44.5 % (ref 37.5–51.0)
Hemoglobin: 14.7 g/dL (ref 13.0–17.7)
Immature Grans (Abs): 0 10*3/uL (ref 0.0–0.1)
Immature Granulocytes: 0 %
Lymphocytes Absolute: 3.8 10*3/uL — ABNORMAL HIGH (ref 0.7–3.1)
Lymphs: 27 %
MCH: 27.7 pg (ref 26.6–33.0)
MCHC: 33 g/dL (ref 31.5–35.7)
MCV: 84 fL (ref 79–97)
MONOS ABS: 1.4 10*3/uL — AB (ref 0.1–0.9)
Monocytes: 10 %
NEUTROS ABS: 7.9 10*3/uL — AB (ref 1.4–7.0)
NEUTROS PCT: 58 %
Platelets: 362 10*3/uL (ref 150–450)
RBC: 5.31 x10E6/uL (ref 4.14–5.80)
RDW: 12.7 % (ref 12.3–15.4)
WBC: 13.9 10*3/uL — ABNORMAL HIGH (ref 3.4–10.8)

## 2018-10-19 ENCOUNTER — Ambulatory Visit (INDEPENDENT_AMBULATORY_CARE_PROVIDER_SITE_OTHER): Payer: Medicare Other

## 2018-10-19 VITALS — BP 122/60 | HR 72

## 2018-10-19 DIAGNOSIS — I10 Essential (primary) hypertension: Secondary | ICD-10-CM

## 2018-10-19 NOTE — Progress Notes (Signed)
Patient came into the office feeling much better. Patient swelling in tongue has gone down and he is able to eat soft foods. Patient BP still in normal range. Did draw a CMP on patient today.  Per Dr. Delrae Alfred have patient to come back in 2 weeks for repeat BP check to make sure BP remains low after stopping BP medication. Will no start him on anything new at this time.  Spoke with patient. Appointment scheduled. Patient verbalized understanding

## 2018-10-20 LAB — COMPREHENSIVE METABOLIC PANEL
ALT: 16 IU/L (ref 0–44)
AST: 20 IU/L (ref 0–40)
Albumin/Globulin Ratio: 1.8 (ref 1.2–2.2)
Albumin: 4.2 g/dL (ref 3.6–4.8)
Alkaline Phosphatase: 133 IU/L — ABNORMAL HIGH (ref 39–117)
BUN/Creatinine Ratio: 18 (ref 10–24)
BUN: 19 mg/dL (ref 8–27)
Bilirubin Total: 0.3 mg/dL (ref 0.0–1.2)
CO2: 23 mmol/L (ref 20–29)
Calcium: 8.8 mg/dL (ref 8.6–10.2)
Chloride: 99 mmol/L (ref 96–106)
Creatinine, Ser: 1.04 mg/dL (ref 0.76–1.27)
GFR calc Af Amer: 85 mL/min/{1.73_m2} (ref >59)
GFR calc non Af Amer: 74 mL/min/{1.73_m2} (ref >59)
Globulin, Total: 2.4 g/dL (ref 1.5–4.5)
Glucose: 128 mg/dL — ABNORMAL HIGH (ref 65–99)
Potassium: 4.5 mmol/L (ref 3.5–5.2)
Sodium: 137 mmol/L (ref 134–144)
Total Protein: 6.6 g/dL (ref 6.0–8.5)

## 2018-10-23 ENCOUNTER — Other Ambulatory Visit: Payer: Medicare Other | Admitting: Licensed Clinical Social Worker

## 2018-11-02 ENCOUNTER — Ambulatory Visit (INDEPENDENT_AMBULATORY_CARE_PROVIDER_SITE_OTHER): Payer: Medicare Other

## 2018-11-02 VITALS — BP 138/60 | HR 78

## 2018-11-02 DIAGNOSIS — I10 Essential (primary) hypertension: Secondary | ICD-10-CM | POA: Diagnosis not present

## 2018-11-02 NOTE — Progress Notes (Signed)
Patient currently not taking any BP medication. Informed patient I would let him know if Dr. Delrae Alfred wants him to start a new BP med or leave him as is without taking BP medication. Patient verbalized understanding.

## 2018-12-15 ENCOUNTER — Other Ambulatory Visit (INDEPENDENT_AMBULATORY_CARE_PROVIDER_SITE_OTHER): Payer: Medicare Other

## 2018-12-15 VITALS — BP 158/100 | HR 82

## 2018-12-15 DIAGNOSIS — R7303 Prediabetes: Secondary | ICD-10-CM | POA: Diagnosis not present

## 2018-12-15 DIAGNOSIS — I10 Essential (primary) hypertension: Secondary | ICD-10-CM

## 2018-12-15 MED ORDER — AMLODIPINE BESYLATE 5 MG PO TABS: 5.0000 mg | ORAL_TABLET | Freq: Every day | ORAL | 11 refills | Status: DC

## 2018-12-15 NOTE — Progress Notes (Signed)
Patient ID: Jeffrey Stafford, male   DOB: 1951-01-23, 67 y.o.   MRN: [id number] Here for lab visit as well as bp check off antihypertensives. BP back up. Start amlodipine 5 mg daily. Not clear if had angioedema with Lisinopril, but will not restart due to concern that he did.  His complaints were not observed by medical personnel Follow up in 2 weeks for bp and pulse

## 2018-12-16 LAB — HGB A1C W/O EAG: Hgb A1c MFr Bld: 6.1 % — ABNORMAL HIGH (ref 4.8–5.6)

## 2018-12-18 ENCOUNTER — Other Ambulatory Visit: Payer: Self-pay

## 2018-12-22 ENCOUNTER — Ambulatory Visit (INDEPENDENT_AMBULATORY_CARE_PROVIDER_SITE_OTHER): Payer: Medicare Other | Admitting: Internal Medicine

## 2018-12-22 ENCOUNTER — Encounter: Payer: Self-pay | Admitting: Internal Medicine

## 2018-12-22 VITALS — BP 138/70 | HR 76 | Resp 12 | Ht 66.25 in | Wt 170.0 lb

## 2018-12-22 DIAGNOSIS — Z720 Tobacco use: Secondary | ICD-10-CM | POA: Diagnosis not present

## 2018-12-22 DIAGNOSIS — R200 Anesthesia of skin: Secondary | ICD-10-CM

## 2018-12-22 DIAGNOSIS — Z716 Tobacco abuse counseling: Secondary | ICD-10-CM | POA: Diagnosis not present

## 2018-12-22 DIAGNOSIS — R202 Paresthesia of skin: Secondary | ICD-10-CM

## 2018-12-22 DIAGNOSIS — I10 Essential (primary) hypertension: Secondary | ICD-10-CM

## 2018-12-22 DIAGNOSIS — R7303 Prediabetes: Secondary | ICD-10-CM | POA: Diagnosis not present

## 2018-12-22 MED ORDER — AMLODIPINE BESYLATE 5 MG PO TABS: 5.0000 mg | ORAL_TABLET | Freq: Every day | ORAL | 11 refills | Status: DC

## 2018-12-22 MED ORDER — NICOTINE 14 MG/24HR TD PT24: 14.0000 mg | MEDICATED_PATCH | Freq: Every day | TRANSDERMAL | 0 refills | Status: DC

## 2018-12-22 MED ORDER — NICOTINE 21 MG/24HR TD PT24: 21.0000 mg | MEDICATED_PATCH | Freq: Every day | TRANSDERMAL | 0 refills | Status: DC

## 2018-12-22 MED ORDER — NICOTINE 7 MG/24HR TD PT24: 7.0000 mg | MEDICATED_PATCH | Freq: Every day | TRANSDERMAL | 0 refills | Status: DC

## 2018-12-22 NOTE — Progress Notes (Signed)
   Subjective:    Patient ID: Jeffrey Stafford, male    DOB: [birth year], 67 y.o.   MRN: [id number]  HPI   1.  Hypertension:  Just obtained the amlodipine 5 days ago.  Tolerating fine.  BP up, but states he was hurrying to get here today.   2.  Tobacco abuse:  Not using patches.  Mom was ill when prescribed patches over 1 year ago.  He states he will consider getting started Jan 2020.   States he is down to 1/2 ppd.  3.  Prediabetes:  A1C about the same at 6.1%.  Feels he is doing well with diet and physical activity.  Walks a lot. He is in the diabetic prevention group with Crista ElliotJanice Holt, RN.  4.  Feet swelled at beginning of December.  Left with tip of left great toe with numbness and tingling.    Current Meds  Medication Sig  . amLODipine (NORVASC) 5 MG tablet Take 1 tablet (5 mg total) by mouth daily.  . [DISCONTINUED] amLODipine (NORVASC) 5 MG tablet Take 1 tablet (5 mg total) by mouth daily.    Allergies  Allergen Reactions  . Lisinopril     Complaint of tongue swelling never observed by medical personnel.  Switching to a different bp med with angioedema as possibility     Review of Systems     Objective:   Physical Exam  Smells of cigarette smoke Lungs:  CTA CV:  RRR without murmur or rub.  Radial pulses normal and equal Feet without swelling.  Skin healthy with brisk cap refill.      Assessment & Plan:  1.  Hypertension:  Did not get started on amlodipine in timely fashion.  Repeat BP check before goes today.  If still up, to keep nurse visit at beginning of January. On recheck 138/70  2.  Prediabetes:  A1C going in right direction.  To continue to work on diet and physical activity. Continue diabetic prevention course.  3.  Left great toe numbness:  Just on tip.  Suspect had some distal nerve compression when feet were swollen.  To let me know if progresses.    4.  Tobacco abuse:  Went over use of nicotine patches again.  [facility]

## 2019-05-04 ENCOUNTER — Telehealth: Payer: Self-pay | Admitting: Internal Medicine

## 2019-05-04 NOTE — Telephone Encounter (Signed)
Patient called stating has been having fever, stomach discomfort, diarrhea x two days, patient denied been taking anything for it and will like to get advises on what to do.   Cherice please advise.

## 2019-05-04 NOTE — Telephone Encounter (Signed)
Spoke with patient. States he was having diarrhea but it has subsided. States he has been sipping on liquids and not eating. Patient was concerned because he is in the house with his elderly mother and does not want to give her coronavirus.   Per Dr. Delrae Alfred have patient continue with liquids until he feels he cant tolerate bland light foods like toast, crackers, rice or plain baked potato. If patient begins to feel worse over the weekend then call the office and she will answer from the on call phone. Patient verbalized understanding.

## 2019-05-28 ENCOUNTER — Encounter: Payer: Self-pay | Admitting: Internal Medicine

## 2019-05-28 ENCOUNTER — Other Ambulatory Visit: Payer: Self-pay

## 2019-05-28 ENCOUNTER — Ambulatory Visit: Payer: Medicare Other | Admitting: Internal Medicine

## 2019-05-28 VITALS — BP 162/98 | HR 72 | Resp 12 | Ht 66.25 in | Wt 178.0 lb

## 2019-05-28 DIAGNOSIS — I509 Heart failure, unspecified: Secondary | ICD-10-CM

## 2019-05-28 DIAGNOSIS — R918 Other nonspecific abnormal finding of lung field: Secondary | ICD-10-CM | POA: Diagnosis not present

## 2019-05-28 DIAGNOSIS — I10 Essential (primary) hypertension: Secondary | ICD-10-CM

## 2019-05-28 DIAGNOSIS — R600 Localized edema: Secondary | ICD-10-CM

## 2019-05-28 DIAGNOSIS — Z9109 Other allergy status, other than to drugs and biological substances: Secondary | ICD-10-CM

## 2019-05-28 DIAGNOSIS — R609 Edema, unspecified: Secondary | ICD-10-CM

## 2019-05-28 DIAGNOSIS — Z720 Tobacco use: Secondary | ICD-10-CM

## 2019-05-28 DIAGNOSIS — Z716 Tobacco abuse counseling: Secondary | ICD-10-CM

## 2019-05-28 MED ORDER — METOPROLOL SUCCINATE ER 25 MG PO TB24: 25.0000 mg | ORAL_TABLET | Freq: Every day | ORAL | 3 refills | Status: DC

## 2019-05-28 MED ORDER — ASPIRIN EC 81 MG PO TBEC: DELAYED_RELEASE_TABLET | ORAL | 3 refills | Status: DC

## 2019-05-28 MED ORDER — FUROSEMIDE 20 MG PO TABS: ORAL_TABLET | ORAL | 3 refills | Status: DC

## 2019-05-28 NOTE — Progress Notes (Signed)
      Subjective:    Patient ID: Jeffrey Stafford, male   DOB: [birth year], 68 y.o.   MRN: [id number]   HPI   1.  Edema of LE/Feet and ankles:  Started over 1 week ago.   He does eat his mom's cooking and he does admit she has been cooking with more salt.   Sits around a lot with his legs dependent.  No dyspnea or chest pain.   No PND or orthopnea. Urination is fine.    He has had some swelling of feet before and also before he was taking Amlodipine, but only his feet swelled then.    Patient denied cough previously, but when pressed stated he had had a hacky cough with posterior pharyngeal drainage.  Also with itchy watery eyes and possibly throat and ears.   Difficulty getting him to focus on current vs long ago symptoms.  No fever, sore throat, myalgias from what can be gathered.       Current Meds  Medication Sig  . amLODipine (NORVASC) 5 MG tablet Take 1 tablet (5 mg total) by mouth daily.   Allergies  Allergen Reactions  . Lisinopril     Complaint of tongue swelling never observed by medical personnel.  Switching to a different bp med with angioedema as possibility     Review of Systems    Objective:   BP (!) 162/98 (BP Location: Left Arm, Patient Position: Sitting, Cuff Size: Normal)   Pulse 72   Resp 12   Ht 5' 6.25" (1.683 m)   Wt 178 lb (80.7 kg)   BMI 28.51 kg/m   Physical Exam  NAD Voice hoarse HEENT:  Minimal nasal mucosal edema with clear discharge.  No posterior pharyngeal cobbling. Neck:  Supple, No adenopathy, no thyromegaly Chest:  Mild bibasilar crackles and wheeze.  Perhaps a bit decreased to percussion over bases. CV:  RRR with normal S1 and S2, No S3, S4 or murmur.  Radial pulses normal and equal.  DP pulses equal, but decreased. No JVD Abd:  S, NT, No HSM or mass.  + BS LE:  Moderate edema of feet and ankles with pitting.  Mild to moderate pitting up to mid tibial area.    ECG shows NSR with one PVC and new RR' in V1 from 12/05/2015.  Albuterol neb 2.5 mg with improved bibasilar air movement and decreased wheeze.  Still with mild bibasilar crackles as well with resonance to percussion over same area. Assessment & Plan   1.  LE edema:  Not clear if related to respiratory issues.  See #2  2.  Wheeze with bibasilar crackles:  Not clear if all of his symptoms are interrelated, but treating them as such with concern for new onset CHF. CBC, CMP, TSH, BNP and Chest Xray. He is to call when he gets the Chest Xray today. Allegra 180 mg daily to get restarted if respiratory issues are more related to pollen and allergies. Furosemide 20 mg daily in morning. Metoprolol XL 25 mg daily. ASA 81 mg daily Recheck BP and pulse at end of week. Appears stable despite these findings.  3.  Tobacco abuse:  Encouraged him to make the changes and use nicotine patches as we have discussed in the past. He does not seem motivated still.

## 2019-05-29 ENCOUNTER — Ambulatory Visit
Admission: RE | Admit: 2019-05-29 | Discharge: 2019-05-29 | Disposition: A | Discharge status: Home / Self Care | Payer: Medicare Other | Source: Ambulatory Visit | Attending: Internal Medicine | Admitting: Internal Medicine

## 2019-05-29 ENCOUNTER — Other Ambulatory Visit (INDEPENDENT_AMBULATORY_CARE_PROVIDER_SITE_OTHER): Payer: Medicare Other

## 2019-05-29 DIAGNOSIS — R609 Edema, unspecified: Secondary | ICD-10-CM

## 2019-05-29 DIAGNOSIS — R918 Other nonspecific abnormal finding of lung field: Secondary | ICD-10-CM

## 2019-05-29 DIAGNOSIS — D649 Anemia, unspecified: Secondary | ICD-10-CM | POA: Diagnosis not present

## 2019-05-29 LAB — CBC WITH DIFFERENTIAL/PLATELET
Basophils Absolute: 0 10*3/uL (ref 0.0–0.2)
Basos: 1 %
EOS (ABSOLUTE): 0.3 10*3/uL (ref 0.0–0.4)
Eos: 5 %
Hematocrit: 37.4 % — ABNORMAL LOW (ref 37.5–51.0)
Hemoglobin: 12.4 g/dL — ABNORMAL LOW (ref 13.0–17.7)
Immature Grans (Abs): 0 10*3/uL (ref 0.0–0.1)
Immature Granulocytes: 0 %
Lymphocytes Absolute: 1.9 10*3/uL (ref 0.7–3.1)
Lymphs: 32 %
MCH: 27.4 pg (ref 26.6–33.0)
MCHC: 33.2 g/dL (ref 31.5–35.7)
MCV: 83 fL (ref 79–97)
Monocytes Absolute: 0.6 10*3/uL (ref 0.1–0.9)
Monocytes: 10 %
Neutrophils Absolute: 3.1 10*3/uL (ref 1.4–7.0)
Neutrophils: 52 %
Platelets: 426 10*3/uL (ref 150–450)
RBC: 4.52 x10E6/uL (ref 4.14–5.80)
RDW: 13.5 % (ref 11.6–15.4)
WBC: 6 10*3/uL (ref 3.4–10.8)

## 2019-05-29 LAB — COMPREHENSIVE METABOLIC PANEL
ALT: 19 IU/L (ref 0–44)
AST: 24 IU/L (ref 0–40)
Albumin/Globulin Ratio: 1.8 (ref 1.2–2.2)
Albumin: 4.6 g/dL (ref 3.8–4.8)
Alkaline Phosphatase: 162 IU/L — ABNORMAL HIGH (ref 39–117)
BUN/Creatinine Ratio: 9 — ABNORMAL LOW (ref 10–24)
BUN: 8 mg/dL (ref 8–27)
Bilirubin Total: 0.3 mg/dL (ref 0.0–1.2)
CO2: 25 mmol/L (ref 20–29)
Calcium: 9.3 mg/dL (ref 8.6–10.2)
Chloride: 99 mmol/L (ref 96–106)
Creatinine, Ser: 0.94 mg/dL (ref 0.76–1.27)
GFR calc Af Amer: 96 mL/min/{1.73_m2} (ref >59)
GFR calc non Af Amer: 83 mL/min/{1.73_m2} (ref >59)
Globulin, Total: 2.5 g/dL (ref 1.5–4.5)
Glucose: 115 mg/dL — ABNORMAL HIGH (ref 65–99)
Potassium: 4.9 mmol/L (ref 3.5–5.2)
Sodium: 138 mmol/L (ref 134–144)
Total Protein: 7.1 g/dL (ref 6.0–8.5)

## 2019-05-29 LAB — BRAIN NATRIURETIC PEPTIDE: BNP: 40.2 pg/mL (ref 0.0–100.0)

## 2019-05-29 LAB — TROPONIN I: Troponin I: <0.01 ng/mL (ref 0.00–0.04)

## 2019-05-29 LAB — TSH: TSH: 0.014 u[IU]/mL — ABNORMAL LOW (ref 0.450–4.500)

## 2019-05-30 LAB — FOLATE RBC
Folate, Hemolysate: 349 ng/mL
Folate, RBC: 933 ng/mL (ref >498)
Hematocrit: 37.4 % — ABNORMAL LOW (ref 37.5–51.0)

## 2019-06-01 ENCOUNTER — Other Ambulatory Visit (INDEPENDENT_AMBULATORY_CARE_PROVIDER_SITE_OTHER): Payer: Medicare Other

## 2019-06-01 ENCOUNTER — Other Ambulatory Visit: Payer: Self-pay

## 2019-06-01 VITALS — BP 138/56 | HR 86

## 2019-06-01 DIAGNOSIS — I10 Essential (primary) hypertension: Secondary | ICD-10-CM | POA: Diagnosis not present

## 2019-06-01 NOTE — Progress Notes (Signed)
Patient BP better than the beginning of the week. Per Dr. Delrae Alfred have patient to continue current dose of medication. Patient verbalized understanding

## 2019-06-04 LAB — HGB A1C W/O EAG: Hgb A1c MFr Bld: 6.2 % — ABNORMAL HIGH (ref 4.8–5.6)

## 2019-06-04 LAB — IRON AND TIBC
Iron Saturation: 9 % — CL (ref 15–55)
Iron: 29 ug/dL — ABNORMAL LOW (ref 38–169)
Total Iron Binding Capacity: 328 ug/dL (ref 250–450)
UIBC: 299 ug/dL (ref 111–343)

## 2019-06-04 LAB — VITAMIN B12: Vitamin B-12: 371 pg/mL (ref 232–1245)

## 2019-06-04 LAB — T4, FREE: Free T4: 1.1 ng/dL (ref 0.82–1.77)

## 2019-06-04 LAB — SPECIMEN STATUS REPORT

## 2019-06-06 ENCOUNTER — Other Ambulatory Visit (INDEPENDENT_AMBULATORY_CARE_PROVIDER_SITE_OTHER): Payer: Medicare Other

## 2019-06-06 ENCOUNTER — Other Ambulatory Visit: Payer: Self-pay

## 2019-06-06 DIAGNOSIS — Z1211 Encounter for screening for malignant neoplasm of colon: Secondary | ICD-10-CM | POA: Diagnosis not present

## 2019-06-06 DIAGNOSIS — D508 Other iron deficiency anemias: Secondary | ICD-10-CM

## 2019-06-06 LAB — POC HEMOCCULT BLD/STL (HOME/3-CARD/SCREEN)
Card #2 Fecal Occult Blod, POC: NEGATIVE
Card #3 Fecal Occult Blood, POC: NEGATIVE
Fecal Occult Blood, POC: NEGATIVE

## 2019-06-07 NOTE — Addendum Note (Signed)
Addended by: Marcelino Duster on: 06/07/2019 08:29 AM   Modules accepted: Orders

## 2019-06-12 ENCOUNTER — Other Ambulatory Visit: Payer: Self-pay

## 2019-06-12 ENCOUNTER — Encounter: Payer: Self-pay | Admitting: Gastroenterology

## 2019-06-12 ENCOUNTER — Ambulatory Visit (INDEPENDENT_AMBULATORY_CARE_PROVIDER_SITE_OTHER): Payer: Medicare Other | Admitting: Gastroenterology

## 2019-06-12 VITALS — Ht 66.0 in | Wt 178.0 lb

## 2019-06-12 DIAGNOSIS — D509 Iron deficiency anemia, unspecified: Secondary | ICD-10-CM | POA: Diagnosis not present

## 2019-06-12 MED ORDER — PEG 3350-KCL-NA BICARB-NACL 420 G PO SOLR: 4000.0000 mL | ORAL | 0 refills | Status: DC

## 2019-06-12 NOTE — Progress Notes (Signed)
This service was provided via virtual visit.  Only audio was used.  The patient was located at home.  I was located in my office.  The patient did consent to this virtual visit and is aware of possible charges through their insurance for this visit.  The patient is a new patient.  My certified medical assistant, Grace Bushy, contributed to this visit by contacting the patient by phone 1 or 2 business days prior to the appointment and also followed up on the recommendations I made after the visit.  Time spent on virtual visit: 23 minutes   HPI: This is a very pleasant 68 year old man whom I am meeting for the first time via telemedicine visit  Lab testing June 2020: Fecal occult stool  Negative 3 cards.  BNP normal.  Complete metabolic profile normal except for alk phos 160, CBC normal except for slightly low hemoglobin at 12.4.  TSH very low.  Total iron slightly low 29, TIBC normal, B12 normal  He started iron pills 4-5 days ago.  No overt blood in his stool.  No constipation or diarrhea.  No abd pains.  His weight is overall stable.  He has not been bothered by nausea or vomiting.  He eats without any difficulties.  He takes an aspirin once daily but no other NSAIDs.  No FH of colon cancer.  No stomach cancer in the family.  Chief complaint is mild iron deficiency anemia  ROS: complete GI ROS as described in HPI, all other review negative.  Constitutional:  No unintentional weight loss   Past Medical History:  Diagnosis Date  . Cocaine substance abuse ([place]) 2014-2015  . Eczema   . Hyperlipidemia   . Hypertension   . Nocturia   . Prediabetes    followed by pcp-- last A1c 6.1 on 05-09-2017 in epic  . Right hydrocele     Past Surgical History:  Procedure Laterality Date  . HYDROCELE EXCISION Right 12/05/2015   Procedure: RIGHT HYDROCELECTOMY ADULT;  Surgeon: Alexis Frock, MD;  [facility];  Service: Urology;  Laterality: Right;  . ORCHIOPEXY Right  12/05/2015   Procedure: ORCHIOPEXY/ SPERMATOCELECTOMY  ADULT;  Surgeon: Alexis Frock, MD;  Location: [facility];  Service: Urology;  Laterality: Right;    Current Outpatient Medications  Medication Sig Dispense Refill  . amLODipine (NORVASC) 5 MG tablet Take 1 tablet (5 mg total) by mouth daily. 30 tablet 11  . aspirin EC 81 MG tablet 1 tab by mouth with morning meal daily 90 tablet 3  . ferrous sulfate 324 MG TBEC Take 324 mg by mouth.    . furosemide (LASIX) 20 MG tablet 1 tab by mouth daily in morning 30 tablet 3  . metoprolol succinate (TOPROL-XL) 25 MG 24 hr tablet Take 1 tablet (25 mg total) by mouth daily. 90 tablet 3   No current facility-administered medications for this visit.     Allergies as of 06/12/2019 - Review Complete 06/12/2019  Allergen Reaction Noted  . Lisinopril  12/15/2018    Family History  Problem Relation Age of Onset  . Diabetes Mother   . Heart disease Mother        Valvular Cardiomyopathy  . Pulmonary embolism Father        complication of amputation of leg for gangrene  . Peripheral vascular disease Father   . Alcohol abuse Father   . Cirrhosis Brother        alcoholic--had shunt  . Lung cancer Brother  had removal of spot from lung  . Alcohol abuse Brother        after death of son  . Drug abuse Brother        Following death of son  . Kidney disease Brother        not clear of cause:  exposure to something while in service in [place].  Peritoneal dialysis.  developed skin infection around catheter and died    Social History   Socioeconomic History  . Marital status: Single    Spouse name: Not on file  . Number of children: 0  . Years of education: Not on file  . Highest education level: Associate degree: occupational, Hotel manager, or vocational program  Occupational History  . Occupation: retired    Comment: Manufacturing systems engineer  . Financial resource strain: Not very hard  . Food insecurity    Worry: Never  true    Inability: Never true  . Transportation needs    Medical: No    Non-medical: No  Tobacco Use  . Smoking status: Current Every Day Smoker    Packs/day: 0.50    Years: 35.00    Pack years: 17.50    Types: Cigarettes  . Smokeless tobacco: Never Used  . Tobacco comment: referral to quit line as not able to get him motivated previously  Substance and Sexual Activity  . Alcohol use: No  . Drug use: Yes    Types: Cocaine    Comment: No use for 1 year  . Sexual activity: Not Currently  Lifestyle  . Physical activity    Days per week: 7 days    Minutes per session: 120 min  . Stress: To some extent  Relationships  . Social connections    Talks on phone: More than three times a week    Gets together: More than three times a week    Attends religious service: Never    Active member of club or organization: Yes    Attends meetings of clubs or organizations: More than 4 times per year    Relationship status: Never married  . Intimate partner violence    Fear of current or ex partner: No    Emotionally abused: No    Physically abused: No    Forced sexual activity: No  Other Topics Concern  . Not on file  Social History Narrative   Lives with his mother in [place].   He is retired, but very active, helping friends and tinkering on cars.   Going to [place]. [employer] [facility].  States he was at 6.4% with beginning of class this year.     Physical Exam: Unable to perform because this was a "telemed visit" due to current Covid-19 pandemic  Assessment and plan: 68 y.o. male with mild iron deficiency anemia  He has slight iron deficiency and very mild anemia.  He does not have microscopic blood in his stool on 3 FOB sets at least.  I recommended colonoscopy and upper endoscopy at his soonest convenience.  I see no reason for any further blood tests or imaging studies prior to then.  Please see the "Patient Instructions" section for addition details  about the plan.  Owens Loffler, MD [facility] 06/12/2019, 10:42 AM

## 2019-06-12 NOTE — Patient Instructions (Addendum)
We will arrange colonoscopy and upper endoscopy at his soonest convenience for iron deficiency anemia  Thank you for entrusting me with your care and choosing [facility].  Dr Ardis Hughs

## 2019-07-04 ENCOUNTER — Other Ambulatory Visit: Payer: Self-pay

## 2019-07-04 ENCOUNTER — Encounter: Payer: Self-pay | Admitting: Internal Medicine

## 2019-07-04 ENCOUNTER — Ambulatory Visit (INDEPENDENT_AMBULATORY_CARE_PROVIDER_SITE_OTHER): Payer: Medicare Other | Admitting: Internal Medicine

## 2019-07-04 VITALS — BP 140/80 | HR 72 | Temp 98.4°F | Ht 66.0 in | Wt 174.5 lb

## 2019-07-04 DIAGNOSIS — R609 Edema, unspecified: Secondary | ICD-10-CM | POA: Diagnosis not present

## 2019-07-04 DIAGNOSIS — I10 Essential (primary) hypertension: Secondary | ICD-10-CM | POA: Diagnosis not present

## 2019-07-04 DIAGNOSIS — Z9109 Other allergy status, other than to drugs and biological substances: Secondary | ICD-10-CM | POA: Diagnosis not present

## 2019-07-04 DIAGNOSIS — R7303 Prediabetes: Secondary | ICD-10-CM

## 2019-07-04 DIAGNOSIS — Z720 Tobacco use: Secondary | ICD-10-CM | POA: Diagnosis not present

## 2019-07-04 LAB — GLUCOSE, POCT (MANUAL RESULT ENTRY): POC Glucose: 174 mg/dl — AB (ref 70–99)

## 2019-07-04 MED ORDER — METOPROLOL SUCCINATE ER 50 MG PO TB24: 50.0000 mg | ORAL_TABLET | Freq: Every day | ORAL | 3 refills | Status: DC

## 2019-07-04 NOTE — Progress Notes (Signed)
++      Subjective:    Patient ID: Jeffrey Stafford, male   DOB: 09-17-1951, 68 y.o.   MRN: [id number]   HPI  See visit from 05/28/2019  1.  Cough and breathing is fine now.  He feels the posterior pharyngeal drainage resolved nicely mainly with the Allegra.   CXR did not show lower respiratory abnormality. BNP also did not support CHF with leg edema and wheezing on exam. He did not have chest pain.   2.  Peripheral edema:  He states peripheral edema has resolved in his ankles and legs.  He is taking the Furosemide 20 mg daily.  He has not missed. Patient has been on Amlodipine 5 mg daily for some time.  Discussed this could be the culprit with the edema. If so, could stop and get rid of Furosemide as well.    3.  Hypertension:  Taking his amlodipine and Metoprolol ER 25 mg daily.  Patient states he was rushing to get here.  May have smoked just before coming in today.    Current Meds  Medication Sig  . amLODipine (NORVASC) 5 MG tablet Take 1 tablet (5 mg total) by mouth daily.  Marland Kitchen aspirin EC 81 MG tablet 1 tab by mouth with morning meal daily  . ferrous sulfate 324 MG TBEC Take 324 mg by mouth.  . furosemide (LASIX) 20 MG tablet 1 tab by mouth daily in morning  . metoprolol succinate (TOPROL-XL) 25 MG 24 hr tablet Take 1 tablet (25 mg total) by mouth daily.   Allergies  Allergen Reactions  . Lisinopril     Complaint of tongue swelling never observed by medical personnel.  Switching to a different bp med with angioedema as possibility     Review of Systems    Objective:   BP 140/80 (BP Location: Left Arm, Patient Position: Sitting, Cuff Size: Normal)   Pulse 72   Temp 98.4 F (36.9 C)   Ht 5\' 6"  (1.676 m)   Wt 174 lb 8 oz (79.2 kg)   BMI 28.17 kg/m   Physical Exam  NAD Smells of cigarette smoke Lungs:  CTA CV:  RRR without murmur or rub.  Radial pulses normal and equal LE:  Mild pitting edema of right ankle though none on left.    Assessment & Plan   1.   Wheezing:  Likely related to allergies and improved with use of Fexofenadine.  2.  Peripheral edema:  Appears from work up to be noncardiac.  Will stop the Amlodipine for now as well as Furosemide and increase Metoprolol ER to 50 mg daily.  Followup in 2 weeks for bp and pulse and for me to take a quick look at ankles. To call if problems with the changes.  3.  Hypertension:  Rechecked at same borderline level.  He smells as if he just smoked.    4.  Tobacco abuse:  Still not ready to move on this.  5.  Prediabetes:  Had a cookie washed down with Pepsi before coming in.  Describes a diet high in carbs.  Went over healthier eating again.  Not clear he is motivated to make changes.  Discussed he is getting quite close to diabetic range.

## 2019-07-09 ENCOUNTER — Telehealth: Payer: Self-pay | Admitting: Gastroenterology

## 2019-07-09 NOTE — Telephone Encounter (Signed)

## 2019-07-10 ENCOUNTER — Telehealth: Payer: Self-pay | Admitting: Gastroenterology

## 2019-07-10 ENCOUNTER — Encounter: Payer: Medicare Other | Admitting: Gastroenterology

## 2019-07-10 NOTE — Telephone Encounter (Signed)
Per Dr. Ardis Hughs, pt is to call office back when ready to schedule procedure- I spoke with pt's mother and she voices understanding

## 2019-07-18 ENCOUNTER — Other Ambulatory Visit: Payer: Self-pay

## 2019-07-18 ENCOUNTER — Other Ambulatory Visit (INDEPENDENT_AMBULATORY_CARE_PROVIDER_SITE_OTHER): Payer: Medicare Other

## 2019-07-18 VITALS — BP 112/60 | HR 76

## 2019-07-18 DIAGNOSIS — I10 Essential (primary) hypertension: Secondary | ICD-10-CM | POA: Diagnosis not present

## 2019-07-18 NOTE — Progress Notes (Signed)
Patient BP in normal range. Informed to continue current dose of medication. Patient verbalized understanding.  Per Dr. Amil Amen patient does have high pitting edema bilaterally on high ankles. Per Dr. Amil Amen patient can use furosemide intermittently as needed for swelling. Patient verbalized understanding.

## 2019-07-18 NOTE — Patient Instructions (Signed)
Per Dr. Amil Amen patient does have high pitting edema bilaterally on high ankles. Per Dr. Amil Amen patient can use furosemide intermittently as needed for swelling. Patient verbalized understanding.

## 2019-10-05 ENCOUNTER — Ambulatory Visit: Payer: Medicare Other | Admitting: Internal Medicine

## 2019-10-05 ENCOUNTER — Other Ambulatory Visit: Payer: Self-pay

## 2019-10-05 ENCOUNTER — Encounter: Payer: Self-pay | Admitting: Internal Medicine

## 2019-10-05 VITALS — BP 140/80 | HR 78 | Resp 12 | Ht 66.0 in | Wt 181.0 lb

## 2019-10-05 DIAGNOSIS — Z9109 Other allergy status, other than to drugs and biological substances: Secondary | ICD-10-CM | POA: Diagnosis not present

## 2019-10-05 DIAGNOSIS — R609 Edema, unspecified: Secondary | ICD-10-CM

## 2019-10-05 DIAGNOSIS — I10 Essential (primary) hypertension: Secondary | ICD-10-CM | POA: Diagnosis not present

## 2019-10-05 DIAGNOSIS — R7303 Prediabetes: Secondary | ICD-10-CM

## 2019-10-05 DIAGNOSIS — Z716 Tobacco abuse counseling: Secondary | ICD-10-CM

## 2019-10-05 DIAGNOSIS — Z720 Tobacco use: Secondary | ICD-10-CM | POA: Diagnosis not present

## 2019-10-05 DIAGNOSIS — E782 Mixed hyperlipidemia: Secondary | ICD-10-CM

## 2019-10-05 NOTE — Patient Instructions (Addendum)
You need PCV 13, influenza, and Tdap Would also recommend shingles vaccine   For next 3 days-take one furosemide pill in the morning.   Call Dr. Eugenia Pancoast office to get set back up with colonoscopy.

## 2019-10-05 NOTE — Progress Notes (Signed)
    Subjective:    Patient ID: Jeffrey Stafford, male   DOB: 01/03/1951, 68 y.o.   MRN: [id number]   HPI   1.  Peripheral edema:  Confusion as to whether he stopped the Amlodipine, but sounds like he did.  He has not been taking Furosemide as well. Metoprolol 50 mg XL once daily. Some increase in edema about 10 days ago.   His sister is putting a lot of salt on the food she cooks. His mother got mad at him for requesting to cook his own meals.   2.  Hypertension:  Took his Metoprolol today.  Did smoke about 1 hour ago.   3.  Tobacco:  Not interested in quitting really--states he is trying and does not want to start drinking ETOH.   Sister with bipolar disorder just moved in with his mother and him and she is having "episodes."  She apparently is not on medication.    4.  HM:  He has not called Dr Ardis Hughs office back to get set up for colonoscopy. Needs influenza, PCV13, and Tdap at [facility]. Also recommend shingles vaccine as well.    Current Meds  Medication Sig  . aspirin EC 81 MG tablet 1 tab by mouth with morning meal daily  . ferrous sulfate 324 MG TBEC Take 324 mg by mouth.  . [DISCONTINUED] amLODipine (NORVASC) 5 MG tablet Take 1 tablet (5 mg total) by mouth daily.   Allergies  Allergen Reactions  . Lisinopril     Complaint of tongue swelling never observed by medical personnel.  Switching to a different bp med with angioedema as possibility     Review of Systems    Objective:   BP 140/80 (BP Location: Left Arm, Patient Position: Sitting, Cuff Size: Normal)   Pulse 78   Resp 12   Ht 5\' 6"  (1.676 m)   Wt 181 lb (82.1 kg)   BMI 29.21 kg/m   Physical Exam  NAD HEENT:  Nasal congestion--not taking allegra and having allergy symptoms as usual for fall (cleaning dusty areas as well).  Constantly sniffling. Neck:  Supple, No adenoapathy Chest:  CTA, mildly decreased BS CV:  RRR without murmur or rub.  Radial and DP pulses normal and equal. LE:  Trace pitting  edema of ankles Assessment & Plan   1.  Peripheral Edema:  To blame his physician when he makes his own meals with less sodium Elevate legs. Has Furosemide 20 mg at home.  To take one tab daily for next 3 mornings  2.  Iron Deficiency Anemia:  Patient to call Dr. Ardis Hughs office and get reappointed for colonoscopy--states he has phone number.  3.  HM:  Tdap, PCV 13 and influenza vaccine at pharmacy.  Prevnar 23 v in 1 year.   Fasting labs in 1 week  4.  Tobacco abuse/counseling: not ready to quit.  Went over options again.  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

## 2019-10-12 ENCOUNTER — Other Ambulatory Visit: Payer: Self-pay

## 2019-10-12 ENCOUNTER — Other Ambulatory Visit (INDEPENDENT_AMBULATORY_CARE_PROVIDER_SITE_OTHER): Payer: Medicare Other

## 2019-10-12 DIAGNOSIS — R7303 Prediabetes: Secondary | ICD-10-CM | POA: Diagnosis not present

## 2019-10-12 DIAGNOSIS — E782 Mixed hyperlipidemia: Secondary | ICD-10-CM

## 2019-10-12 DIAGNOSIS — Z79899 Other long term (current) drug therapy: Secondary | ICD-10-CM

## 2019-10-12 DIAGNOSIS — Z1159 Encounter for screening for other viral diseases: Secondary | ICD-10-CM

## 2019-10-13 LAB — CBC WITH DIFFERENTIAL/PLATELET
Basophils Absolute: 0.1 10*3/uL (ref 0.0–0.2)
Basos: 2 %
EOS (ABSOLUTE): 0.3 10*3/uL (ref 0.0–0.4)
Eos: 3 %
Hematocrit: 38.2 % (ref 37.5–51.0)
Hemoglobin: 12.8 g/dL — ABNORMAL LOW (ref 13.0–17.7)
Immature Grans (Abs): 0 10*3/uL (ref 0.0–0.1)
Immature Granulocytes: 0 %
Lymphocytes Absolute: 3 10*3/uL (ref 0.7–3.1)
Lymphs: 34 %
MCH: 28.8 pg (ref 26.6–33.0)
MCHC: 33.5 g/dL (ref 31.5–35.7)
MCV: 86 fL (ref 79–97)
Monocytes Absolute: 0.6 10*3/uL (ref 0.1–0.9)
Monocytes: 7 %
Neutrophils Absolute: 4.7 10*3/uL (ref 1.4–7.0)
Neutrophils: 54 %
Platelets: 310 10*3/uL (ref 150–450)
RBC: 4.45 x10E6/uL (ref 4.14–5.80)
RDW: 13.7 % (ref 11.6–15.4)
WBC: 8.8 10*3/uL (ref 3.4–10.8)

## 2019-10-13 LAB — COMPREHENSIVE METABOLIC PANEL
ALT: 40 IU/L (ref 0–44)
AST: 44 IU/L — ABNORMAL HIGH (ref 0–40)
Albumin/Globulin Ratio: 1.5 (ref 1.2–2.2)
Albumin: 4.4 g/dL (ref 3.8–4.8)
Alkaline Phosphatase: 127 IU/L — ABNORMAL HIGH (ref 39–117)
BUN/Creatinine Ratio: 9 — ABNORMAL LOW (ref 10–24)
BUN: 11 mg/dL (ref 8–27)
Bilirubin Total: 0.5 mg/dL (ref 0.0–1.2)
CO2: 25 mmol/L (ref 20–29)
Calcium: 9.1 mg/dL (ref 8.6–10.2)
Chloride: 99 mmol/L (ref 96–106)
Creatinine, Ser: 1.17 mg/dL (ref 0.76–1.27)
GFR calc Af Amer: 74 mL/min/{1.73_m2} (ref >59)
GFR calc non Af Amer: 64 mL/min/{1.73_m2} (ref >59)
Globulin, Total: 3 g/dL (ref 1.5–4.5)
Glucose: 103 mg/dL — ABNORMAL HIGH (ref 65–99)
Potassium: 5 mmol/L (ref 3.5–5.2)
Sodium: 137 mmol/L (ref 134–144)
Total Protein: 7.4 g/dL (ref 6.0–8.5)

## 2019-10-13 LAB — LIPID PANEL W/O CHOL/HDL RATIO
Cholesterol, Total: 189 mg/dL (ref 100–199)
HDL: 45 mg/dL (ref >39)
LDL Chol Calc (NIH): 122 mg/dL — ABNORMAL HIGH (ref 0–99)
Triglycerides: 124 mg/dL (ref 0–149)
VLDL Cholesterol Cal: 22 mg/dL (ref 5–40)

## 2019-10-13 LAB — HEPATITIS C ANTIBODY: Hep C Virus Ab: <0.1 s/co ratio (ref 0.0–0.9)

## 2019-10-13 LAB — HGB A1C W/O EAG: Hgb A1c MFr Bld: 6.4 % — ABNORMAL HIGH (ref 4.8–5.6)

## 2020-02-05 ENCOUNTER — Ambulatory Visit (INDEPENDENT_AMBULATORY_CARE_PROVIDER_SITE_OTHER): Payer: Medicare Other | Admitting: Internal Medicine

## 2020-02-05 ENCOUNTER — Other Ambulatory Visit: Payer: Self-pay

## 2020-02-05 ENCOUNTER — Encounter: Payer: Self-pay | Admitting: Internal Medicine

## 2020-02-05 VITALS — BP 132/70 | HR 78 | Resp 12 | Ht 66.0 in | Wt 183.0 lb

## 2020-02-05 DIAGNOSIS — R609 Edema, unspecified: Secondary | ICD-10-CM

## 2020-02-05 DIAGNOSIS — I10 Essential (primary) hypertension: Secondary | ICD-10-CM | POA: Diagnosis not present

## 2020-02-05 DIAGNOSIS — R7303 Prediabetes: Secondary | ICD-10-CM

## 2020-02-05 DIAGNOSIS — Z716 Tobacco abuse counseling: Secondary | ICD-10-CM

## 2020-02-05 DIAGNOSIS — Z720 Tobacco use: Secondary | ICD-10-CM | POA: Diagnosis not present

## 2020-02-05 NOTE — Progress Notes (Signed)
    Subjective:    Patient ID: Jeffrey Stafford, male   DOB: 02-04-1951, 69 y.o.   MRN: [id number]   HPI   1.  Tobacco Use:  Not really ready to stop as his sister is still in the home and not getting care for her bipolar disorder.  He is not interested in having one of our social work interns contacting her for care as concerned she will think he is getting into her business.  2.  Peripheral edema:  Stopped eating the salty food his sister was preparing.  No problems with edema of legs since went back to his low sodium diet.   He is elevating his legs more and is more physically active.   3.  Hypertension:  Tolerating Amlodipine fine.  4.  Anemia:  Has not called to reset visit with Dr. Christella Hartigan, GI for diagnostic colonoscopy.  States he is waiting for his older sister to find time in her schedule to accompany him.    5.  Prediabetes:   Eating better, though weight continues up a bit.   Current Meds  Medication Sig  . amLODipine (NORVASC) 10 MG tablet Take 10 mg by mouth daily.  Marland Kitchen aspirin EC 81 MG tablet 1 tab by mouth with morning meal daily  . ELDERBERRY PO Take by mouth. 1 daily  . ferrous sulfate 324 MG TBEC Take 324 mg by mouth.   Allergies  Allergen Reactions  . Lisinopril     Complaint of tongue swelling never observed by medical personnel.  Switching to a different bp med with angioedema as possibility     Review of Systems    Objective:   BP 132/70 (BP Location: Left Arm, Patient Position: Sitting, Cuff Size: Normal)   Pulse 78   Resp 12   Ht 5\' 6"  (1.676 m)   Wt 183 lb (83 kg)   BMI 29.54 kg/m   Physical Exam  Nasally congested sounding--states was cleaning dusty stuff out recently. Neck:  Supple, No adenopathy Chest:  CTA CV:  RRR with normal S1 and S2, No S3, S4 or murmur.  No carotid bruits.  Carotid, radial and DP pulses normal and equal.  Mild pitting edema to upper pretibial area bilaterally. Abd:  S, NT, No HSM or mass + BS   Assessment & Plan  1.   Hypertension:  Controlled, but with peripheral edema, concerned he is still taking Amlodipine as was to switch to Metoprolol ER 50 mg daily.  He will call with which medication he is taking once he gets home.   If indeed, he is taking Amlodipine, will switch to Metoprolol tomorrow and return for bp and pulse check in 2 weeks.  2.  Peripheral edema:  Felt secondary to previously increased salt intake and possibly Amlodipine.  As above with bp med.   3.  Iron deficiency anemia:  Encouraged Jeffrey Stafford to get moving on making his appt with Dr. Bascom Levels for colonoscopy.  This is not something to delay.  He does not want Christella Hartigan to make the appt.  4.  Tobacco:  Not ready to quit with his sister causing stress in household.  5.  Prediabetes:  A1C.  Not clear he has made good lifestyle changes as weight is up a bit.

## 2020-02-06 LAB — HGB A1C W/O EAG: Hgb A1c MFr Bld: 6.4 % — ABNORMAL HIGH (ref 4.8–5.6)

## 2020-02-19 ENCOUNTER — Other Ambulatory Visit: Payer: Self-pay

## 2020-02-19 ENCOUNTER — Ambulatory Visit (INDEPENDENT_AMBULATORY_CARE_PROVIDER_SITE_OTHER): Payer: Medicare Other

## 2020-02-19 VITALS — BP 120/60 | HR 68

## 2020-02-19 DIAGNOSIS — I10 Essential (primary) hypertension: Secondary | ICD-10-CM | POA: Diagnosis not present

## 2020-02-19 NOTE — Progress Notes (Signed)
Patient came in for BP check. BP in normal range. Informed to continue current dose of medication. Patient verbalized understanding. 

## 2020-07-02 ENCOUNTER — Encounter: Payer: Medicare Other | Admitting: Internal Medicine

## 2020-08-12 ENCOUNTER — Encounter: Payer: Self-pay | Admitting: Internal Medicine

## 2020-08-12 ENCOUNTER — Ambulatory Visit
Admission: RE | Admit: 2020-08-12 | Discharge: 2020-08-12 | Disposition: A | Discharge status: Home / Self Care | Payer: Medicare Other | Source: Ambulatory Visit | Attending: Internal Medicine | Admitting: Internal Medicine

## 2020-08-12 ENCOUNTER — Ambulatory Visit: Payer: Medicare Other | Admitting: Internal Medicine

## 2020-08-12 VITALS — BP 130/84 | HR 70 | Resp 12 | Ht 66.0 in | Wt 185.0 lb

## 2020-08-12 DIAGNOSIS — Z716 Tobacco abuse counseling: Secondary | ICD-10-CM

## 2020-08-12 DIAGNOSIS — Z9109 Other allergy status, other than to drugs and biological substances: Secondary | ICD-10-CM

## 2020-08-12 DIAGNOSIS — Z720 Tobacco use: Secondary | ICD-10-CM

## 2020-08-12 DIAGNOSIS — R062 Wheezing: Secondary | ICD-10-CM

## 2020-08-12 DIAGNOSIS — D649 Anemia, unspecified: Secondary | ICD-10-CM | POA: Diagnosis not present

## 2020-08-12 DIAGNOSIS — R7303 Prediabetes: Secondary | ICD-10-CM

## 2020-08-12 DIAGNOSIS — M1990 Unspecified osteoarthritis, unspecified site: Secondary | ICD-10-CM

## 2020-08-12 DIAGNOSIS — R609 Edema, unspecified: Secondary | ICD-10-CM

## 2020-08-12 DIAGNOSIS — I10 Essential (primary) hypertension: Secondary | ICD-10-CM

## 2020-08-12 MED ORDER — NICOTINE 21 MG/24HR TD PT24: 21.0000 mg | MEDICATED_PATCH | Freq: Every day | TRANSDERMAL | 0 refills | Status: DC

## 2020-08-12 MED ORDER — NICOTINE 7 MG/24HR TD PT24: 7.0000 mg | MEDICATED_PATCH | Freq: Every day | TRANSDERMAL | 0 refills | Status: DC

## 2020-08-12 MED ORDER — NICOTINE 14 MG/24HR TD PT24: 14.0000 mg | MEDICATED_PATCH | Freq: Every day | TRANSDERMAL | 0 refills | Status: DC

## 2020-08-12 MED ORDER — DOXYCYCLINE HYCLATE 100 MG PO TABS: 100.0000 mg | ORAL_TABLET | Freq: Two times a day (BID) | ORAL | 0 refills | Status: DC

## 2020-08-12 NOTE — Patient Instructions (Signed)
Recommend nicotine patches:  21 mg to skin and change daily for 28 days, then down to 14 mg patches for 14 days, then down to 7 mg patches for 14 days, then stop. Get rid of all cigarettes, lighters, ash trays, smoking paraphernalia before start first patch

## 2020-08-12 NOTE — Progress Notes (Signed)
Subjective:    Patient ID: Jeffrey Stafford, male   DOB: 1951-09-08, 69 y.o.   MRN: [id number]   HPI   1.  Anemia:  Still has not made another appt with Dr. Christella Hartigan, GI.  His sister now with a diagnosis of cancer--not clear if colon or GU cancer, but she does have a colostomy.    2.  Hypertension:  Controlled.  Has intermittent swelling of legs for which he takes intermittent Furosemide.  Not clear any improvement since stopped Amlodipine.    3.  Tobacco abuse: He is okay with trying patches.  He states he has seen a class action on nicotine patches due to some health issue.  I am unable to find this.   He would like to consider   4.  Something--a small bump of some sort-- on middle of low thoracic back 3 weeks ago that was sore.  He couldn't see if was a scab or a tick.  Scratched it off, but was sore for a week or 2 afterward.  Has had stiffness and soreness of middle fingers of both hands.   He has not noted any rashes other places of body--later states he does have an itchy rash on volar aspect of right wrist for 2 weeks--states he had a temporary plastic like bracelet on that wrist that made him itchy.  Has had on opposite wrist for same reason in past.  . His mother looked at his back, but just stated it looked like he had been scratching. No fevers, headache, loss of appetite, myalgias or expanding rashes other than wrists.  Later, states bilateral shoulders hurting over same time period as well.   Current Meds  Medication Sig  . aspirin EC 81 MG tablet 1 tab by mouth with morning meal daily  . ELDERBERRY PO Take by mouth. 1 daily  . furosemide (LASIX) 20 MG tablet 1 tab by mouth daily in morning  . metoprolol succinate (TOPROL-XL) 50 MG 24 hr tablet Take 1 tablet (50 mg total) by mouth daily. Take with or immediately following a meal.   Allergies  Allergen Reactions  . Lisinopril     Complaint of tongue swelling never observed by medical personnel.  Switching to a different  bp med with angioedema as possibility     Review of Systems    Objective:   BP 130/84 (BP Location: Left Arm, Patient Position: Sitting, Cuff Size: Normal)   Pulse 70   Resp 12   Ht 5\' 6"  (1.676 m)   Wt 185 lb (83.9 kg)   BMI 29.86 kg/m   Physical Exam  NAD HEENT:  PERRL, EOMI, eyes a bit watery.  Nasal mucosa mildly boggy.  Throat without injection Neck:  Supple, No adenopathy Chest:  Wheezy bilateral lower lung fields with crackles--states he was exposed to lawn cuttings and weed killer yesterday and always sets off allergies. CV:  RRR without murmur or rub.  Radial and DP pulses normal and equal.  LE with minimal edema at ankles. Abd:  S, NT, No HSM or mass, + BS Joints:  No erythema or swelling of any joint.   Full ROM.      Assessment & Plan   1.  ?tick bite with possible rash--now resolved:  Unlikely to have serology + Lymes at this point, so will go ahead and treat with Doxy 100 mg twice daily for 10 days.  No EM rash to see at this time. Follow up 14 days after completion  of Doxy.  2.  Contact dermatitis of wrists:  Triamcinolone cream  3.  Tobacco abuse:  Nicotine patches  4.  Allergies:  To take OTC antihistamine.  CXR with wheeze and smoking history, however  5.  Joint complaints:  Sed rate, RF, ANA.  6.  Hypertension:  Controlled.  7.  Edema:  Mild.

## 2020-08-13 ENCOUNTER — Other Ambulatory Visit (INDEPENDENT_AMBULATORY_CARE_PROVIDER_SITE_OTHER): Payer: Medicare Other

## 2020-08-13 DIAGNOSIS — D649 Anemia, unspecified: Secondary | ICD-10-CM

## 2020-08-13 DIAGNOSIS — M1990 Unspecified osteoarthritis, unspecified site: Secondary | ICD-10-CM | POA: Diagnosis not present

## 2020-08-13 DIAGNOSIS — R7303 Prediabetes: Secondary | ICD-10-CM

## 2020-08-13 DIAGNOSIS — I10 Essential (primary) hypertension: Secondary | ICD-10-CM

## 2020-08-13 DIAGNOSIS — E782 Mixed hyperlipidemia: Secondary | ICD-10-CM

## 2020-08-14 ENCOUNTER — Other Ambulatory Visit: Payer: Self-pay | Admitting: Internal Medicine

## 2020-08-15 LAB — CBC WITH DIFFERENTIAL/PLATELET
Basophils Absolute: 0.1 10*3/uL (ref 0.0–0.2)
Basos: 1 %
EOS (ABSOLUTE): 0.3 10*3/uL (ref 0.0–0.4)
Eos: 4 %
Hematocrit: 41.2 % (ref 37.5–51.0)
Hemoglobin: 13.6 g/dL (ref 13.0–17.7)
Immature Grans (Abs): 0 10*3/uL (ref 0.0–0.1)
Immature Granulocytes: 0 %
Lymphocytes Absolute: 2.3 10*3/uL (ref 0.7–3.1)
Lymphs: 32 %
MCH: 28.9 pg (ref 26.6–33.0)
MCHC: 33 g/dL (ref 31.5–35.7)
MCV: 88 fL (ref 79–97)
Monocytes Absolute: 0.6 10*3/uL (ref 0.1–0.9)
Monocytes: 8 %
Neutrophils Absolute: 4.1 10*3/uL (ref 1.4–7.0)
Neutrophils: 55 %
Platelets: 313 10*3/uL (ref 150–450)
RBC: 4.7 x10E6/uL (ref 4.14–5.80)
RDW: 13.2 % (ref 11.6–15.4)
WBC: 7.4 10*3/uL (ref 3.4–10.8)

## 2020-08-15 LAB — ANTINUCLEAR ANTIBODIES, IFA: ANA Titer 1: NEGATIVE

## 2020-08-15 LAB — COMPREHENSIVE METABOLIC PANEL
ALT: 17 IU/L (ref 0–44)
AST: 22 IU/L (ref 0–40)
Albumin/Globulin Ratio: 1.7 (ref 1.2–2.2)
Albumin: 4.4 g/dL (ref 3.8–4.8)
Alkaline Phosphatase: 137 IU/L — ABNORMAL HIGH (ref 48–121)
BUN/Creatinine Ratio: 8 — ABNORMAL LOW (ref 10–24)
BUN: 10 mg/dL (ref 8–27)
Bilirubin Total: 0.5 mg/dL (ref 0.0–1.2)
CO2: 24 mmol/L (ref 20–29)
Calcium: 9.2 mg/dL (ref 8.6–10.2)
Chloride: 103 mmol/L (ref 96–106)
Creatinine, Ser: 1.22 mg/dL (ref 0.76–1.27)
GFR calc Af Amer: 69 mL/min/{1.73_m2} (ref >59)
GFR calc non Af Amer: 60 mL/min/{1.73_m2} (ref >59)
Globulin, Total: 2.6 g/dL (ref 1.5–4.5)
Glucose: 93 mg/dL (ref 65–99)
Potassium: 4.9 mmol/L (ref 3.5–5.2)
Sodium: 140 mmol/L (ref 134–144)
Total Protein: 7 g/dL (ref 6.0–8.5)

## 2020-08-15 LAB — LIPID PANEL W/O CHOL/HDL RATIO
Cholesterol, Total: 217 mg/dL — ABNORMAL HIGH (ref 100–199)
HDL: 43 mg/dL (ref >39)
LDL Chol Calc (NIH): 138 mg/dL — ABNORMAL HIGH (ref 0–99)
Triglycerides: 203 mg/dL — ABNORMAL HIGH (ref 0–149)
VLDL Cholesterol Cal: 36 mg/dL (ref 5–40)

## 2020-08-15 LAB — SEDIMENTATION RATE: Sed Rate: 16 mm/hr (ref 0–30)

## 2020-08-15 LAB — RHEUMATOID FACTOR: Rheumatoid fact SerPl-aCnc: <10 IU/mL (ref 0.0–13.9)

## 2020-08-15 LAB — HGB A1C W/O EAG: Hgb A1c MFr Bld: 6.2 % — ABNORMAL HIGH (ref 4.8–5.6)

## 2020-09-08 ENCOUNTER — Ambulatory Visit: Payer: Medicare Other | Admitting: Internal Medicine

## 2020-09-08 ENCOUNTER — Encounter: Payer: Self-pay | Admitting: Internal Medicine

## 2020-09-08 VITALS — BP 138/80 | HR 66 | Resp 12 | Ht 66.0 in | Wt 186.0 lb

## 2020-09-08 DIAGNOSIS — R21 Rash and other nonspecific skin eruption: Secondary | ICD-10-CM

## 2020-09-08 DIAGNOSIS — W57XXXD Bitten or stung by nonvenomous insect and other nonvenomous arthropods, subsequent encounter: Secondary | ICD-10-CM

## 2020-09-08 NOTE — Progress Notes (Signed)
    Subjective:    Patient ID: Jeffrey Stafford, male   DOB: 1951/07/14, 69 y.o.   MRN: [id number]   HPI   Tick bite with reported rash on wrists.  Treated with 10 days of Doxycycline 100 mg bid with resolution of bump and rash.    The rash on his wrists appeared to be contact dermatitis and recommended to use Triamcinolone cream, but did not obtain the latter   He has not obtained the nicotine patches--promises he will.  Also, will take care of reappointing colonoscopy   Current Meds  Medication Sig  . EQ ASPIRIN ADULT LOW DOSE 81 MG EC tablet TAKE 1 TABLET BY MOUTH ONCE DAILY WITH MORNING MEAL  . metoprolol succinate (TOPROL-XL) 50 MG 24 hr tablet TAKE 1 TABLET BY MOUTH ONCE DAILY WITH OR IMMEDIATELY FOLLOWING A MEAL   Allergies  Allergen Reactions  . Lisinopril     Complaint of tongue swelling never observed by medical personnel.  Switching to a different bp med with angioedema as possibility     Review of Systems    Objective:   BP 138/80 (BP Location: Left Arm, Patient Position: Sitting, Cuff Size: Normal)   Pulse 66   Resp 12   Ht 5\' 6"  (1.676 m)   Wt 186 lb (84.4 kg)   BMI 30.02 kg/m   Physical Exam   No evidence of rash at wrists currently and no lesion on back.   Assessment & Plan   Tick bite:  Area of bite healed.  Still feel the rash on wrists was unrelated.

## 2020-09-16 ENCOUNTER — Encounter: Payer: Self-pay | Admitting: Gastroenterology

## 2020-10-21 ENCOUNTER — Telehealth: Payer: Self-pay | Admitting: *Deleted

## 2020-10-21 NOTE — Telephone Encounter (Signed)
He needs both EGD and colonoscopy as originally planned. If not enough time on current [facility] schedule that day then please reschedule for another day that has ample time.  Thanks

## 2020-10-21 NOTE — Telephone Encounter (Signed)
I RS him to same day 11-17 at 2 pm for a double - RS with pt- Jeffrey Stafford [facility]

## 2020-10-21 NOTE — Telephone Encounter (Signed)
Dr Christella Hartigan,  You saw this pt in the office 06-12-2019 for IDA- he had scheduled an ECL and then it was canceled due to elevated BP- he has called back and only scheduled a colon- 08-13-2020 PCP lab work, hgb is 13.6-  Do you want him to have both the colon and EGD or is colon all he needs at this time.  Please advise, Thanks Hilda Lias [facility]

## 2020-10-29 ENCOUNTER — Ambulatory Visit (AMBULATORY_SURGERY_CENTER): Payer: Self-pay

## 2020-10-29 ENCOUNTER — Other Ambulatory Visit: Payer: Self-pay

## 2020-10-29 VITALS — Ht 66.0 in | Wt 189.0 lb

## 2020-10-29 DIAGNOSIS — D509 Iron deficiency anemia, unspecified: Secondary | ICD-10-CM

## 2020-10-29 MED ORDER — NA SULFATE-K SULFATE-MG SULF 17.5-3.13-1.6 GM/177ML PO SOLN: 1.0000 | Freq: Once | ORAL | 0 refills | Status: AC

## 2020-10-29 NOTE — Progress Notes (Signed)
No egg or soy allergy known to patient  No issues with past sedation with any surgeries or procedures No intubation problems in the past  No FH of Malignant Hyperthermia No diet pills per patient No home 02 use per patient  No blood thinners per patient  Pt denies issues with constipation  No A fib or A flutter  COVID 19 guidelines implemented in [facility] today with Pt and RN  Coupon given to pt in [facility] today , Code to Pharmacy  COVID vaccines completed on 02/2020 per pt;  Due to the COVID-19 pandemic we are asking patients to follow these guidelines. Please only bring one care partner. Please be aware that your care partner may wait in the car in the parking lot or if they feel like they will be too hot to wait in the car, they may wait in the lobby on the 4th floor. All care partners are required to wear a mask the entire time (we do not have any that we can provide them), they need to practice social distancing, and we will do a Covid check for all patient's and care partners when you arrive. Also we will check their temperature and your temperature. If the care partner waits in their car they need to stay in the parking lot the entire time and we will call them on their cell phone when the patient is ready for discharge so they can bring the car to the front of the building. Also all patient's will need to wear a mask into building.  

## 2020-11-12 ENCOUNTER — Encounter: Payer: Medicare Other | Admitting: Gastroenterology

## 2020-12-02 ENCOUNTER — Encounter: Payer: Medicare Other | Admitting: Gastroenterology

## 2020-12-02 ENCOUNTER — Encounter (HOSPITAL_COMMUNITY): Payer: Self-pay

## 2020-12-02 ENCOUNTER — Ambulatory Visit (INDEPENDENT_AMBULATORY_CARE_PROVIDER_SITE_OTHER): Payer: Medicare Other

## 2020-12-02 ENCOUNTER — Ambulatory Visit (HOSPITAL_COMMUNITY)
Admission: EM | Admit: 2020-12-02 | Discharge: 2020-12-02 | Disposition: A | Discharge status: Home / Self Care | Payer: Medicare Other | Attending: Emergency Medicine | Admitting: Emergency Medicine

## 2020-12-02 ENCOUNTER — Telehealth: Payer: Self-pay | Admitting: Gastroenterology

## 2020-12-02 DIAGNOSIS — M25511 Pain in right shoulder: Secondary | ICD-10-CM

## 2020-12-02 DIAGNOSIS — M25531 Pain in right wrist: Secondary | ICD-10-CM

## 2020-12-02 DIAGNOSIS — M19039 Primary osteoarthritis, unspecified wrist: Secondary | ICD-10-CM

## 2020-12-02 DIAGNOSIS — S46911A Strain of unspecified muscle, fascia and tendon at shoulder and upper arm level, right arm, initial encounter: Secondary | ICD-10-CM

## 2020-12-02 DIAGNOSIS — W19XXXA Unspecified fall, initial encounter: Secondary | ICD-10-CM

## 2020-12-02 DIAGNOSIS — S60211A Contusion of right wrist, initial encounter: Secondary | ICD-10-CM

## 2020-12-02 DIAGNOSIS — M19019 Primary osteoarthritis, unspecified shoulder: Secondary | ICD-10-CM

## 2020-12-02 MED ORDER — TIZANIDINE HCL 4 MG PO TABS: 4.0000 mg | ORAL_TABLET | Freq: Every day | ORAL | 0 refills | Status: DC

## 2020-12-02 MED ORDER — PREDNISONE 20 MG PO TABS: ORAL_TABLET | ORAL | 0 refills | Status: DC

## 2020-12-02 NOTE — Telephone Encounter (Signed)
Good afternoon Dr. Christella Hartigan, patient called to cancel his procedure today, stated not feeling well.  Will call back to reschedule.  Thank you.

## 2020-12-02 NOTE — ED Triage Notes (Signed)
Pt presents with right wrist and right shoulder injury after a fall 2 days ago.

## 2020-12-02 NOTE — ED Provider Notes (Signed)
[facility]   MRN: [id number] DOB: 1951-12-02  Subjective:   Jeffrey Stafford is a 69 y.o. male presenting for suffering an accidental fall 2 days ago.  Patient states that he tripped and fell landing awkwardly making impact with his right wrist and then folding his arm under him hurting his right shoulder.  Patient has used Voltaren gel with minimal relief.  Denies history of arthritis, bony deformity, ecchymosis.  He has had loss of range of motion at the shoulder level due to his pain.  Denies head injury, head trauma, loss conscious, confusion.  Denies history of MI, heart disease, heart failure.  Patient does have a history of hypertension and takes furosemide and metoprolol for this.  No current facility-administered medications for this encounter.  Current Outpatient Medications:  .  furosemide (LASIX) 20 MG tablet, 1 tab by mouth daily in morning (Patient taking differently: as needed. 1 tab by mouth daily in morning), Disp: 30 tablet, Rfl: 3 .  metoprolol succinate (TOPROL-XL) 50 MG 24 hr tablet, TAKE 1 TABLET BY MOUTH ONCE DAILY WITH OR IMMEDIATELY FOLLOWING A MEAL, Disp: 90 tablet, Rfl: 3   Allergies  Allergen Reactions  . Lisinopril     Complaint of tongue swelling never observed by medical personnel.  Switching to a different bp med with angioedema as possibility    Past Medical History:  Diagnosis Date  . Allergy    seasonal allergies  . Anemia    hx of  . Anxiety    hx of  . Arthritis    generalized  . Cocaine substance abuse (HCC) 2014-2015  . Depression    hx of  . Eczema   . GERD (gastroesophageal reflux disease)    with certain foods  . Hyperlipidemia    not taking meds at this time  . Hypertension    not taking meds at this time  . Nocturia   . Prediabetes    followed by pcp-- last A1c 6.1 on 05-09-2017 in epic  . Right hydrocele      Past Surgical History:  Procedure Laterality Date  . HYDROCELE EXCISION Right 12/05/2015    Procedure: RIGHT HYDROCELECTOMY ADULT;  Surgeon: Sebastian Ache, MD;  Location: [facility];  Service: Urology;  Laterality: Right;  . ORCHIOPEXY Right 12/05/2015   Procedure: ORCHIOPEXY/ SPERMATOCELECTOMY  ADULT;  Surgeon: Sebastian Ache, MD;  Location: [facility];  Service: Urology;  Laterality: Right;  . TONSILLECTOMY    . WISDOM TOOTH EXTRACTION      Family History  Problem Relation Age of Onset  . Diabetes Mother   . Heart disease Mother        Valvular Cardiomyopathy  . Pulmonary embolism Father        complication of amputation of leg for gangrene  . Peripheral vascular disease Father   . Alcohol abuse Father   . Cirrhosis Brother        alcoholic--had shunt  . Lung cancer Brother        had removal of spot from lung  . Alcohol abuse Brother        after death of son  . Drug abuse Brother        Following death of son  . Kidney disease Brother        not clear of cause:  exposure to something while in service in [place].  Peritoneal dialysis.  developed skin infection around catheter and died  . Bipolar disorder Sister  Social History   Tobacco Use  . Smoking status: Current Every Day Smoker    Packs/day: 0.50    Years: 35.00    Pack years: 17.50    Types: Cigarettes  . Smokeless tobacco: Never Used  . Tobacco comment: referral to quit line as not able to get him motivated previously  Vaping Use  . Vaping Use: Never used  Substance Use Topics  . Alcohol use: Yes    Alcohol/week: 15.0 standard drinks    Types: 15 Standard drinks or equivalent per week  . Drug use: Yes    Types: Cocaine    Comment: No use for 1 year    ROS   Objective:   Vitals: BP (!) 148/61 (BP Location: Left Arm)   Pulse 70   Temp 98.6 F (37 C) (Oral)   Resp 17   SpO2 98%   Physical Exam Constitutional:      General: He is not in acute distress.    Appearance: Normal appearance. He is well-developed and normal weight. He is not ill-appearing,  toxic-appearing or diaphoretic.  HENT:     Head: Normocephalic and atraumatic.     Right Ear: External ear normal.     Left Ear: External ear normal.     Nose: Nose normal.     Mouth/Throat:     Pharynx: Oropharynx is clear.  Eyes:     General: No scleral icterus.       Right eye: No discharge.        Left eye: No discharge.     Extraocular Movements: Extraocular movements intact.     Pupils: Pupils are equal, round, and reactive to light.  Cardiovascular:     Rate and Rhythm: Normal rate.  Pulmonary:     Effort: Pulmonary effort is normal.  Musculoskeletal:     Cervical back: Normal range of motion.     Comments: Decreased external rotation, abduction of the shoulder.  Slightly decreased radial and ulnar deviation for the right wrist.  No ecchymosis, swelling, warmth or erythema.  Neurological:     Mental Status: He is alert and oriented to person, place, and time.  Psychiatric:        Mood and Affect: Mood normal.        Behavior: Behavior normal.        Thought Content: Thought content normal.        Judgment: Judgment normal.     DG Shoulder Right  Result Date: 12/02/2020 CLINICAL DATA:  Fall. EXAM: RIGHT SHOULDER - 2+ VIEW COMPARISON:  Chest x-ray 05/29/2019. FINDINGS: Acromioclavicular and glenohumeral degenerative change. No evidence of fracture or dislocation. No evidence of separation. IMPRESSION: Acromioclavicular and glenohumeral degenerative change. No acute abnormality. Electronically Signed   By: Maisie Fus  Register   On: 12/02/2020 14:47   DG Wrist Complete Right  Result Date: 12/02/2020 CLINICAL DATA:  Right wrist pain after fall 2 days ago. EXAM: RIGHT WRIST - COMPLETE 3+ VIEW COMPARISON:  None. FINDINGS: There is no evidence of fracture or dislocation. Severe narrowing of radiocarpal joint is noted. Soft tissues are unremarkable. IMPRESSION: No acute abnormality seen in the right wrist. Severe degenerative changes seen involving the radiocarpal joint.  Electronically Signed   By: Lupita Raider M.D.   On: 12/02/2020 14:47    Assessment and Plan :   PDMP not reviewed this encounter.  1. Right wrist pain   2. Pain in joint of right shoulder   3. Wrist arthritis   4. Shoulder  arthritis   5. Contusion of right wrist, initial encounter   6. Shoulder strain, right, initial encounter     Given patient's extensive arthritis and his recent injury recommended an oral prednisone course to help in his recovery.  Counseled on general nature of arthritis which is newly found today.  Recommended follow-up with his PCP and consideration for an orthopedic referral.  Use Tylenol and/or tizanidine for supportive care. Counseled patient on potential for adverse effects with medications prescribed/recommended today, ER and return-to-clinic precautions discussed, patient verbalized understanding.    Wallis Bamberg, PA-C 12/02/20 1512

## 2020-12-02 NOTE — Discharge Instructions (Signed)
Please just use Tylenol at a dose of 500mg -650mg  once every 6 hours as needed for your aches, pains, fevers. It is okay to use Tylenol, prednisone and tizanidine together. Do not use any nonsteroidal anti-inflammatories (NSAIDs) like ibuprofen, Motrin, naproxen, Aleve, etc. which are all available over-the-counter.

## 2020-12-27 DIAGNOSIS — E119 Type 2 diabetes mellitus without complications: Secondary | ICD-10-CM

## 2020-12-27 HISTORY — DX: Type 2 diabetes mellitus without complications: E11.9

## 2021-01-09 ENCOUNTER — Encounter: Payer: Self-pay | Admitting: Internal Medicine

## 2021-01-09 ENCOUNTER — Ambulatory Visit (INDEPENDENT_AMBULATORY_CARE_PROVIDER_SITE_OTHER): Payer: Self-pay | Admitting: Internal Medicine

## 2021-01-09 ENCOUNTER — Other Ambulatory Visit: Payer: Self-pay

## 2021-01-09 VITALS — BP 128/68

## 2021-01-09 DIAGNOSIS — I10 Essential (primary) hypertension: Secondary | ICD-10-CM

## 2021-01-09 DIAGNOSIS — R609 Edema, unspecified: Secondary | ICD-10-CM

## 2021-01-09 DIAGNOSIS — Z716 Tobacco abuse counseling: Secondary | ICD-10-CM

## 2021-01-09 DIAGNOSIS — R7303 Prediabetes: Secondary | ICD-10-CM

## 2021-01-09 DIAGNOSIS — M1990 Unspecified osteoarthritis, unspecified site: Secondary | ICD-10-CM

## 2021-01-09 DIAGNOSIS — D649 Anemia, unspecified: Secondary | ICD-10-CM

## 2021-01-09 DIAGNOSIS — Z720 Tobacco use: Secondary | ICD-10-CM

## 2021-01-09 NOTE — Progress Notes (Signed)
    Subjective:    Patient ID: Jeffrey Stafford, male   DOB: 1951/06/20, 70 y.o.   MRN: [id number]   HPI   1.  Hypertension:  Well controlled with Metoprolol.  Using Furosemide only as needed with intermittent LE edema, which seems more related to salt in diet.    2.  Arthritis:  Found to have arthritis in several areas following fall in December when seen in Urgent care.  Using his neighbor's Diclofenac cream when he needs for stiffness--maybe once monthly.  He does not feel he needs regular medication for these aches and pains.   Was treated in urgent care with corticosteroid treatment for a short period. Discussed pool exercise. He has stationary bikes at home as well.    3. Nasal stuffiness:  Noted in room--states from wearing a mask and fine when not wearing one.    4.  Tobacco abuse:  Not interested in quitting--"too much going on"  5.  Anemia:  Colonoscopy put off again reportedly as he was on corticosteroids from his fall per patient.  Sister is being treated for unknown type cancer.  He thinks is colon or breast, but still not clear.  Discussed he needs to find out.  6.  Prediabetes:  Drinking sugar in coffee.  Recent prednisone.  nonfasting today       Current Meds  Medication Sig   furosemide (LASIX) 20 MG tablet 1 tab by mouth daily in morning (Patient taking differently: as needed. 1 tab by mouth daily in morning)   metoprolol succinate (TOPROL-XL) 50 MG 24 hr tablet TAKE 1 TABLET BY MOUTH ONCE DAILY WITH OR IMMEDIATELY FOLLOWING A MEAL   tiZANidine (ZANAFLEX) 4 MG tablet Take 1 tablet (4 mg total) by mouth at bedtime.   Allergies  Allergen Reactions   Lisinopril     Complaint of tongue swelling never observed by medical personnel.  Switching to a different bp med with angioedema as possibility     Review of Systems    Objective:   BP 128/68 (BP Location: Right Arm, Patient Position: Sitting, Cuff Size: Normal)   Physical Exam LE:  Very dry skin, but no  edema  Assessment & Plan    Hypertension with intermittent LE edema, currently both controlled.  2.  Arthritis:  Topical Diclofenac as needed  3.  Nasal congestion:  patient feels related to mask usage.  Will call if becomes more of an issues without masking.  4.  Anemia:  Encouraged him to get back in touch with [facility].  Has been referred multiple times.  He has contact information.  Also, needs to clarify sister's cancer diagnosis.  5.  Tobacco abuse:  not interested in treatment at this time to quit.    6.  Prediabetes:  A1C  Fasting labs in 6 months followed by CPE

## 2021-01-10 LAB — HGB A1C W/O EAG: Hgb A1c MFr Bld: 6.5 % — ABNORMAL HIGH (ref 4.8–5.6)

## 2021-02-04 IMAGING — DX CHEST - 2 VIEW
2 series · 2 of 2 positions shown · non-contrast
Comparison: 01/07/2017

CLINICAL DATA: Edema, crackles, and wheezing

EXAM:
CHEST - 2 VIEW

[dg chest 2 view (1 of 2)]
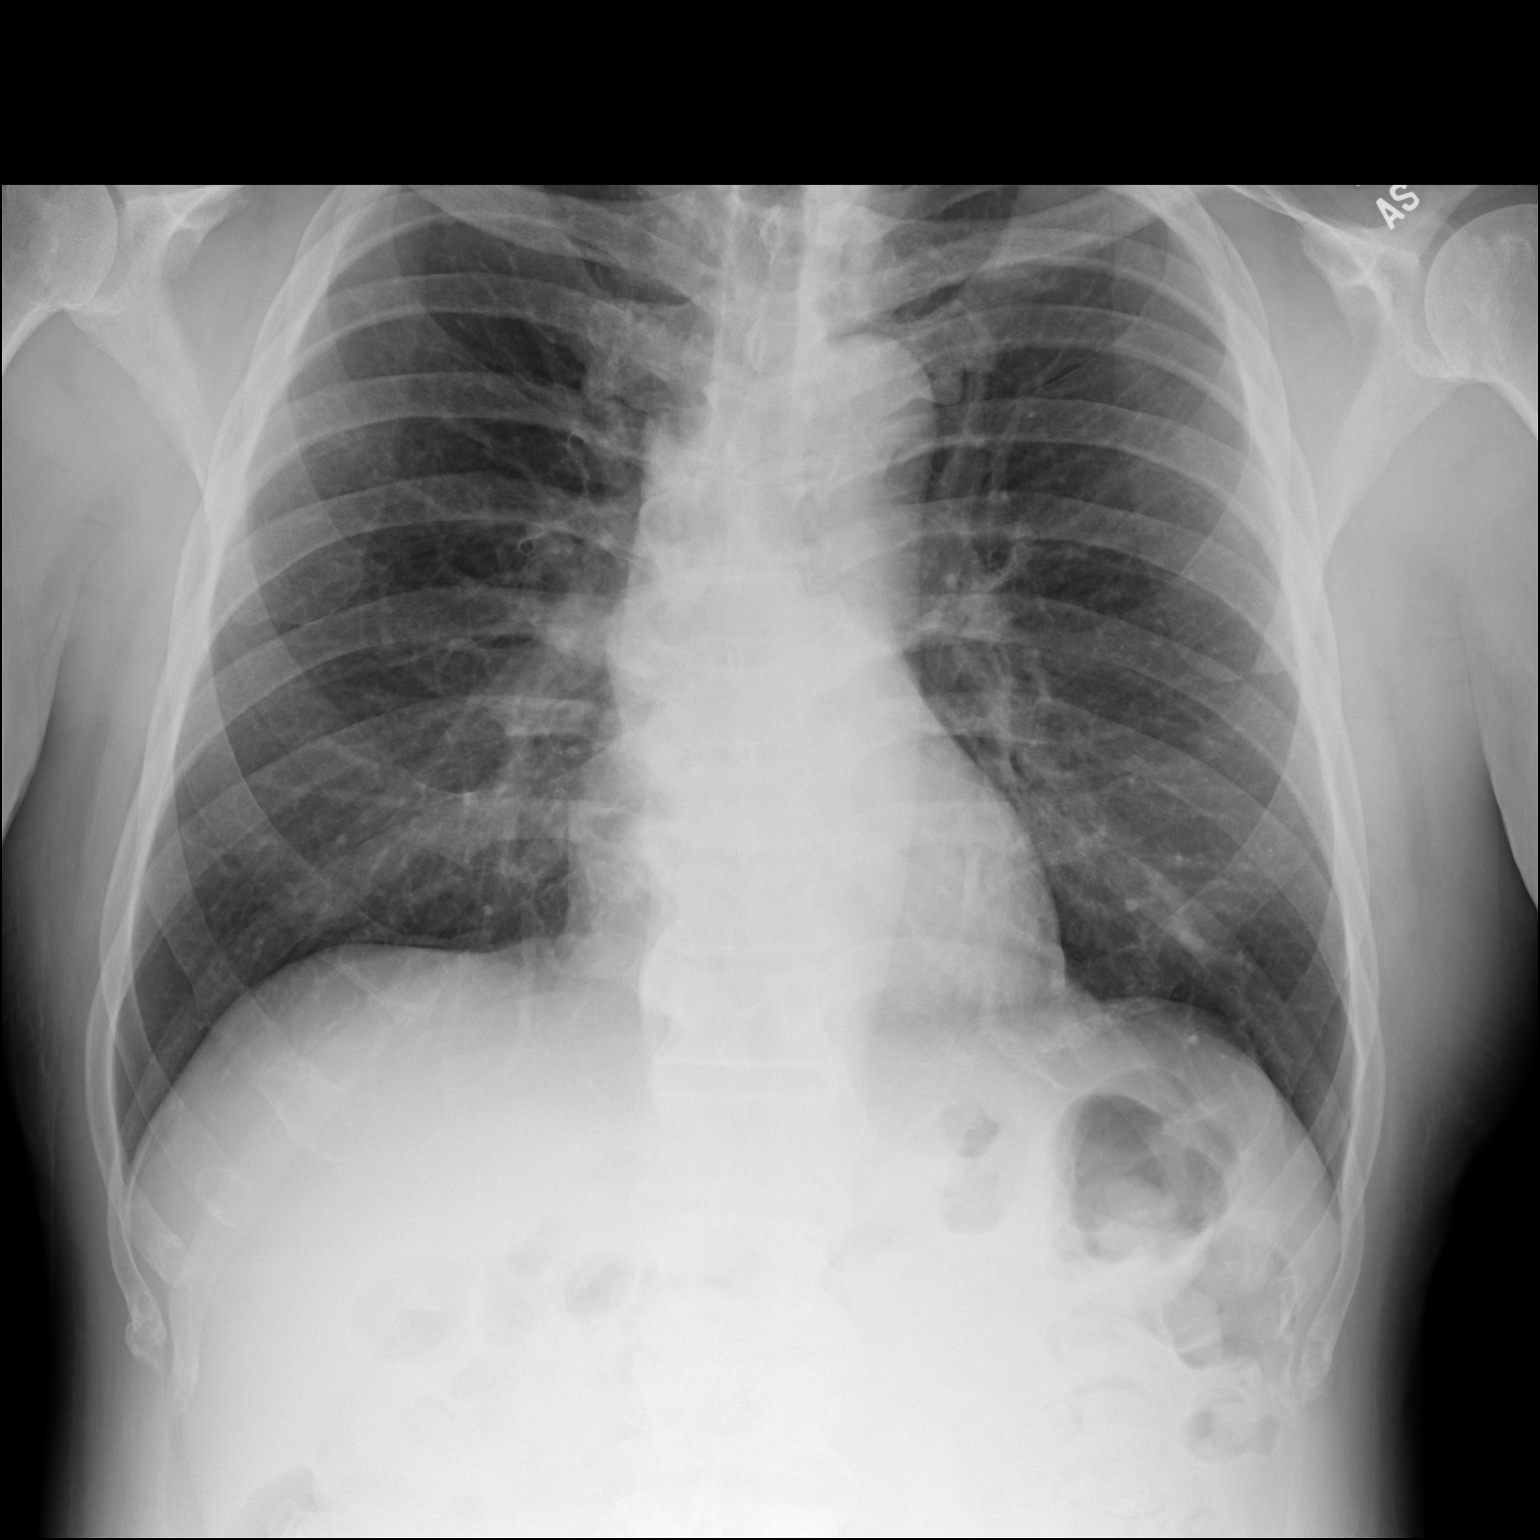

[dg chest 2 view (2 of 2)]
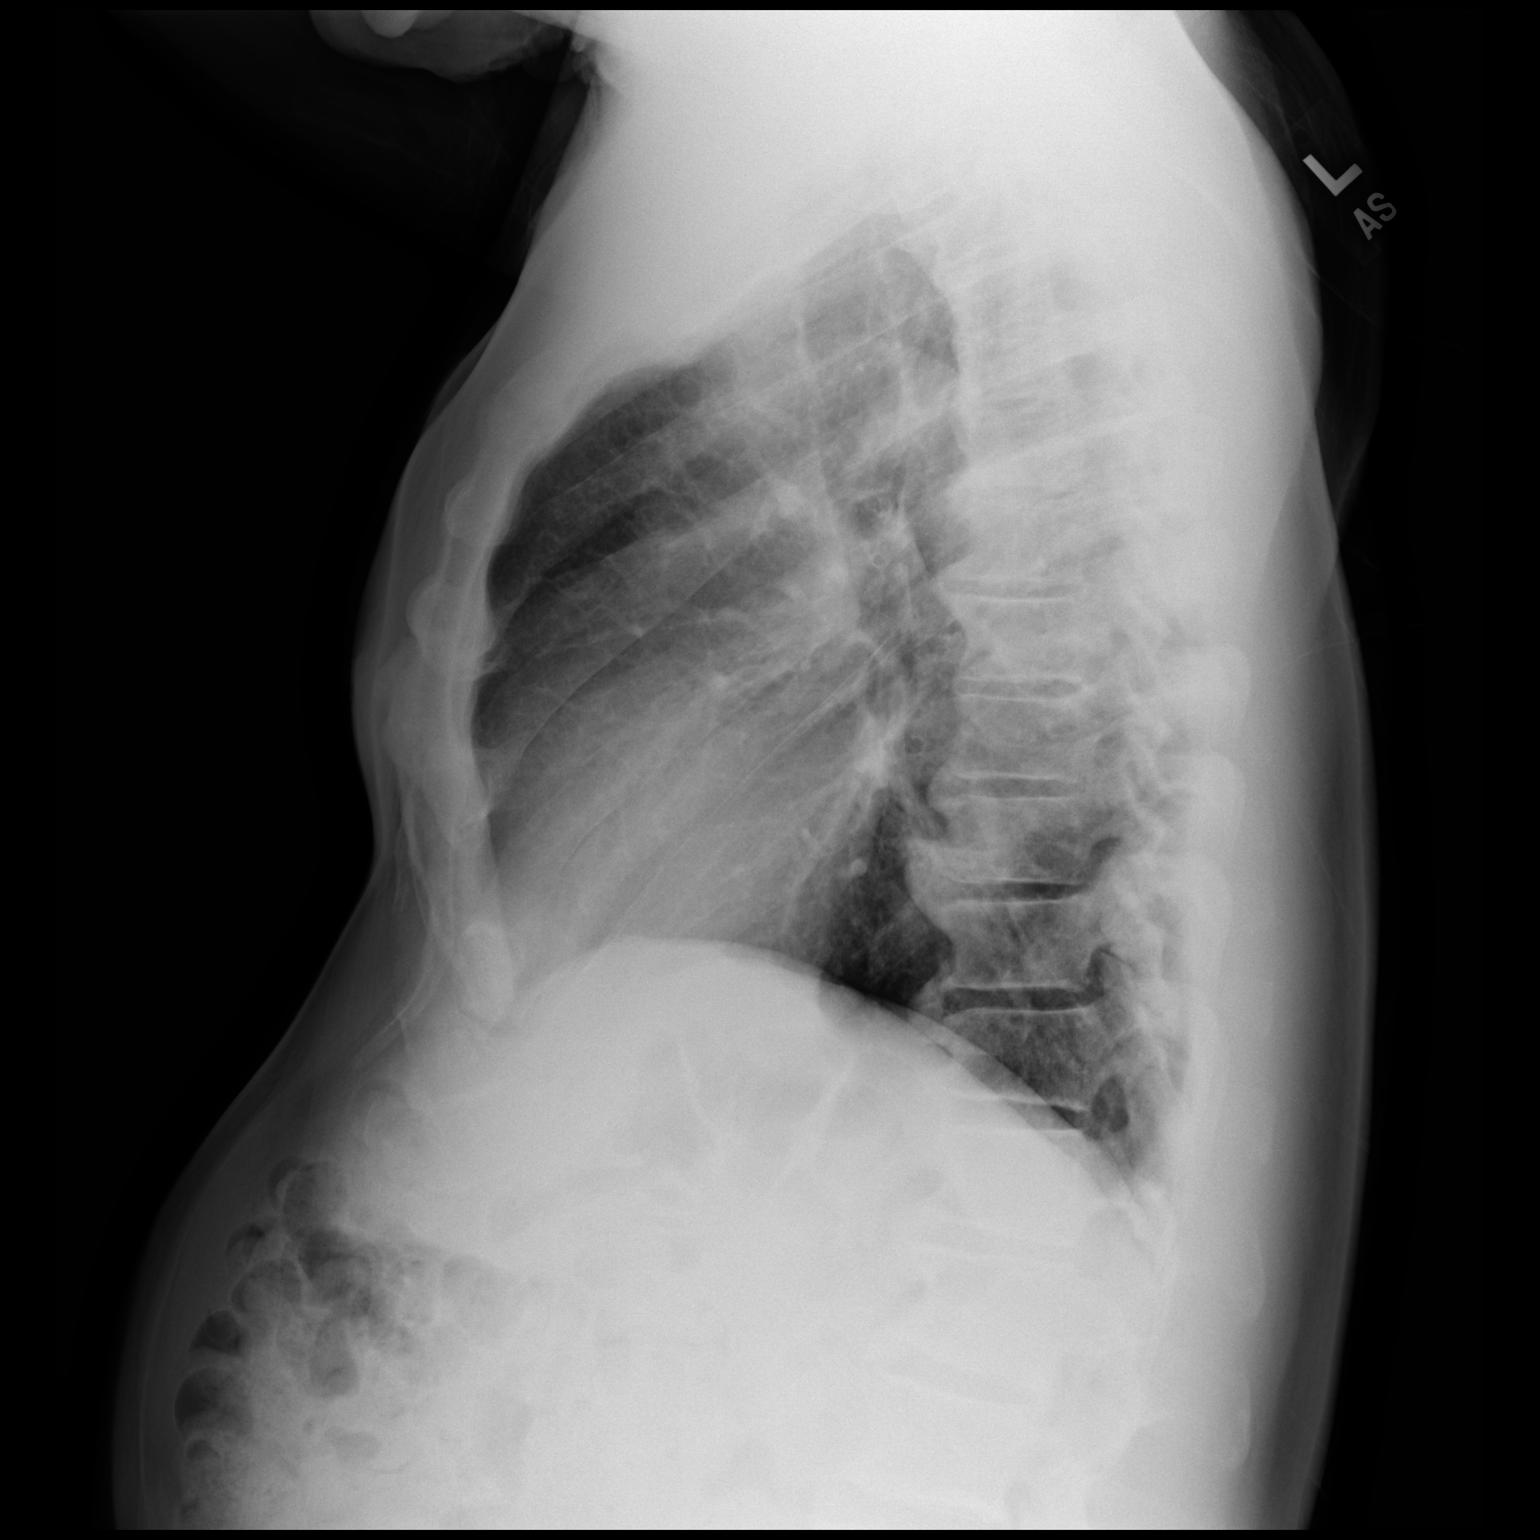

[2 of 2 positions shown; findings below may reference images not displayed]

FINDINGS: The heart size and mediastinal contours are within normal limits.
Both lungs are clear. Disc degenerative disease of the thoracic
spine.
IMPRESSION: No acute abnormality of the lungs.  No focal airspace opacity.

## 2021-05-29 ENCOUNTER — Encounter: Payer: Self-pay | Admitting: Internal Medicine

## 2021-05-29 ENCOUNTER — Ambulatory Visit (INDEPENDENT_AMBULATORY_CARE_PROVIDER_SITE_OTHER): Payer: Medicare Other | Admitting: Internal Medicine

## 2021-05-29 ENCOUNTER — Other Ambulatory Visit: Payer: Self-pay

## 2021-05-29 VITALS — BP 130/78 | HR 64 | Resp 12 | Ht 66.0 in | Wt 185.0 lb

## 2021-05-29 DIAGNOSIS — S40261A Insect bite (nonvenomous) of right shoulder, initial encounter: Secondary | ICD-10-CM

## 2021-05-29 DIAGNOSIS — W57XXXA Bitten or stung by nonvenomous insect and other nonvenomous arthropods, initial encounter: Secondary | ICD-10-CM

## 2021-05-29 MED ORDER — DOXYCYCLINE HYCLATE 100 MG PO TABS: ORAL_TABLET | ORAL | 0 refills | Status: DC

## 2021-05-29 NOTE — Progress Notes (Signed)
    Subjective:    Patient ID: Jeffrey Stafford, male   DOB: 1951/12/02, 70 y.o.   MRN: [id number]   HPI   Last night, realized he had an attached tick or upper right back about 10:30 p.m.  Tick was not engorged and he feels the tick was likely there for about 5 hours based on when he was outside.  He had taken a shower the evening before and it was not there and he would have noted it.    Current Meds  Medication Sig  . furosemide (LASIX) 20 MG tablet 1 tab by mouth daily in morning (Patient taking differently: as needed. 1 tab by mouth daily in morning)  . metoprolol succinate (TOPROL-XL) 50 MG 24 hr tablet TAKE 1 TABLET BY MOUTH ONCE DAILY WITH OR IMMEDIATELY FOLLOWING A MEAL  . tiZANidine (ZANAFLEX) 4 MG tablet Take 1 tablet (4 mg total) by mouth at bedtime.   Allergies  Allergen Reactions  . Lisinopril     Complaint of tongue swelling never observed by medical personnel.  Switching to a different bp med with angioedema as possibility     Review of Systems    Objective:   BP 130/78 (BP Location: Left Arm, Patient Position: Sitting, Cuff Size: Normal)   Pulse 64   Resp 12   Ht 5\' 6"  (1.676 m)   Wt 185 lb (83.9 kg)   BMI 29.86 kg/m   Physical Exam   Dime sized elevated erythematous area with central puctum.  No discharge.  Minimal tenderness..     He has a smashed tick that is about the size of a sesame seed, with rounder body shape and light tan legs with possibly mildy red posterior body ?dog tick   Assessment & Plan  Tick bite:  Based on area of where tick was removed, possibility that it was there longer than he realizes.  Will go ahead and treat with Doxycycline 200 mg for one dose. He is to watch for signs or symptoms of other tick borne disease.

## 2021-05-29 NOTE — Patient Instructions (Signed)
Call if headache, fever, expanding rash.

## 2021-07-06 ENCOUNTER — Other Ambulatory Visit: Payer: Self-pay

## 2021-07-06 ENCOUNTER — Other Ambulatory Visit: Payer: Medicare Other

## 2021-07-06 DIAGNOSIS — E782 Mixed hyperlipidemia: Secondary | ICD-10-CM

## 2021-07-06 DIAGNOSIS — Z79899 Other long term (current) drug therapy: Secondary | ICD-10-CM

## 2021-07-06 DIAGNOSIS — R7303 Prediabetes: Secondary | ICD-10-CM

## 2021-07-06 DIAGNOSIS — Z125 Encounter for screening for malignant neoplasm of prostate: Secondary | ICD-10-CM

## 2021-07-06 NOTE — Addendum Note (Signed)
Addended by: Duayne Cal on: 07/06/2021 02:20 PM   Modules accepted: Orders

## 2021-07-07 LAB — COMPREHENSIVE METABOLIC PANEL
ALT: 14 IU/L (ref 0–44)
AST: 20 IU/L (ref 0–40)
Albumin/Globulin Ratio: 1.8 (ref 1.2–2.2)
Albumin: 4.8 g/dL (ref 3.8–4.8)
Alkaline Phosphatase: 144 IU/L — ABNORMAL HIGH (ref 44–121)
BUN/Creatinine Ratio: 10 (ref 10–24)
BUN: 11 mg/dL (ref 8–27)
Bilirubin Total: 0.5 mg/dL (ref 0.0–1.2)
CO2: 21 mmol/L (ref 20–29)
Calcium: 9.4 mg/dL (ref 8.6–10.2)
Chloride: 97 mmol/L (ref 96–106)
Creatinine, Ser: 1.14 mg/dL (ref 0.76–1.27)
Globulin, Total: 2.7 g/dL (ref 1.5–4.5)
Glucose: 95 mg/dL (ref 65–99)
Potassium: 5.1 mmol/L (ref 3.5–5.2)
Sodium: 138 mmol/L (ref 134–144)
Total Protein: 7.5 g/dL (ref 6.0–8.5)
eGFR: 69 mL/min/{1.73_m2} (ref >59)

## 2021-07-07 LAB — HEMOGLOBIN A1C
Est. average glucose Bld gHb Est-mCnc: 140 mg/dL
Hgb A1c MFr Bld: 6.5 % — ABNORMAL HIGH (ref 4.8–5.6)

## 2021-07-07 LAB — LIPID PANEL W/O CHOL/HDL RATIO
Cholesterol, Total: 260 mg/dL — ABNORMAL HIGH (ref 100–199)
HDL: 49 mg/dL (ref >39)
LDL Chol Calc (NIH): 175 mg/dL — ABNORMAL HIGH (ref 0–99)
Triglycerides: 191 mg/dL — ABNORMAL HIGH (ref 0–149)
VLDL Cholesterol Cal: 36 mg/dL (ref 5–40)

## 2021-07-07 LAB — PSA

## 2021-07-07 LAB — CBC WITH DIFFERENTIAL/PLATELET
Basophils Absolute: 0.1 10*3/uL (ref 0.0–0.2)
Basos: 1 %
EOS (ABSOLUTE): 0.2 10*3/uL (ref 0.0–0.4)
Eos: 3 %
Hematocrit: 40.4 % (ref 37.5–51.0)
Hemoglobin: 13.1 g/dL (ref 13.0–17.7)
Immature Grans (Abs): 0 10*3/uL (ref 0.0–0.1)
Immature Granulocytes: 0 %
Lymphocytes Absolute: 1.8 10*3/uL (ref 0.7–3.1)
Lymphs: 26 %
MCH: 28.8 pg (ref 26.6–33.0)
MCHC: 32.4 g/dL (ref 31.5–35.7)
MCV: 89 fL (ref 79–97)
Monocytes Absolute: 0.6 10*3/uL (ref 0.1–0.9)
Monocytes: 9 %
Neutrophils Absolute: 4.2 10*3/uL (ref 1.4–7.0)
Neutrophils: 61 %
Platelets: 264 10*3/uL (ref 150–450)
RBC: 4.55 x10E6/uL (ref 4.14–5.80)
RDW: 13.1 % (ref 11.6–15.4)
WBC: 6.9 10*3/uL (ref 3.4–10.8)

## 2021-07-08 LAB — PSA: Prostate Specific Ag, Serum: 0.3 ng/mL (ref 0.0–4.0)

## 2021-07-08 LAB — SPECIMEN STATUS REPORT

## 2021-07-09 ENCOUNTER — Ambulatory Visit (INDEPENDENT_AMBULATORY_CARE_PROVIDER_SITE_OTHER): Payer: Medicare Other | Admitting: Internal Medicine

## 2021-07-09 ENCOUNTER — Other Ambulatory Visit: Payer: Self-pay

## 2021-07-09 ENCOUNTER — Encounter: Payer: Self-pay | Admitting: Internal Medicine

## 2021-07-09 VITALS — BP 150/74 | HR 72 | Resp 12 | Ht 67.0 in | Wt 180.0 lb

## 2021-07-09 DIAGNOSIS — E119 Type 2 diabetes mellitus without complications: Secondary | ICD-10-CM

## 2021-07-09 DIAGNOSIS — Z800 Family history of malignant neoplasm of digestive organs: Secondary | ICD-10-CM

## 2021-07-09 DIAGNOSIS — Z0000 Encounter for general adult medical examination without abnormal findings: Secondary | ICD-10-CM

## 2021-07-09 DIAGNOSIS — Z1211 Encounter for screening for malignant neoplasm of colon: Secondary | ICD-10-CM

## 2021-07-09 DIAGNOSIS — N433 Hydrocele, unspecified: Secondary | ICD-10-CM

## 2021-07-09 DIAGNOSIS — I10 Essential (primary) hypertension: Secondary | ICD-10-CM

## 2021-07-09 DIAGNOSIS — Z720 Tobacco use: Secondary | ICD-10-CM

## 2021-07-09 DIAGNOSIS — Z23 Encounter for immunization: Secondary | ICD-10-CM

## 2021-07-09 DIAGNOSIS — E782 Mixed hyperlipidemia: Secondary | ICD-10-CM

## 2021-07-09 MED ORDER — ROSUVASTATIN CALCIUM 10 MG PO TABS: ORAL_TABLET | ORAL | 11 refills | Status: DC

## 2021-07-09 NOTE — Progress Notes (Signed)
Subjective:    Patient ID: [place] Stafford, male   DOB: 04/27/1951, 70 y.o.   MRN: [id number]   HPI  Here for Male CPE:  1.  STE:  Does perform.  Right side with fluid around it again.  Noted again couple months ago.  States he had a flashlight on a keyring he keeps on belt loop next to left groin and hit his right testicle, which subsequently swelled.  No family history of testicular cancer.  2.  PSA: 0.3 ng/ml--normal on 7/11.  No family history of prostate cancer.   3.  Guaiac Cards:  Performed last 05/2019 and negative for blood.  Mild anemia in 05/2019, but normal with recent CBC.    4.  Colonoscopy:  Still has not had not colonoscopy done.  Sister with history of colon cancer at age 70.  He states he has not followed up with previous referrals for colonoscopy due to family issues at home--mainly sister living with him and their mother.    5.  Cholesterol/Glucose:  Cholesterol much higher.  A1C stable at 6.5% with diet controlled DM.   Lipid Panel     Component Value Date/Time   CHOL 260 (H) 07/06/2021 1024   TRIG 191 (H) 07/06/2021 1024   HDL 49 07/06/2021 1024   LDLCALC 175 (H) 07/06/2021 1024   LABVLDL 36 07/06/2021 1024     6.  Immunizations:  Has not had second Shingrix vaccine, nor COVID 2nd booster. Immunization History  Administered Date(s) Administered   Influenza Inj Mdck Quad Pf 12/02/2017   Influenza, High Dose Seasonal PF 09/24/2018, 10/05/2019   Moderna SARS-COV2 Booster Vaccination 07/09/2021   Moderna Sars-Covid-2 Vaccination 02/25/2020, 03/24/2020, 12/29/2020   Pneumococcal Conjugate-13 10/05/2019   Tdap 10/05/2019   Zoster Recombinat (Shingrix) 10/05/2019     Current Meds  Medication Sig   furosemide (LASIX) 20 MG tablet 1 tab by mouth daily in morning (Patient taking differently: as needed. 1 tab by mouth daily in morning)   metoprolol succinate (TOPROL-XL) 50 MG 24 hr tablet TAKE 1 TABLET BY MOUTH ONCE DAILY WITH OR IMMEDIATELY FOLLOWING A MEAL    Allergies  Allergen Reactions   Lisinopril     Complaint of tongue swelling never observed by medical personnel.  Switching to a different bp med with angioedema as possibility   Past Medical History:  Diagnosis Date   Allergy    seasonal allergies   Anemia    hx of   Anxiety    hx of   Arthritis    generalized   Cocaine substance abuse (HCC) 2014-2015   Depression    hx of   DM (diabetes mellitus), type 2 (HCC)    followed by pcp-- last A1c 6.1 on 05-09-2017 in epic   Eczema    GERD (gastroesophageal reflux disease)    with certain foods   Hyperlipidemia    not taking meds at this time   Hypertension    not taking meds at this time   Nocturia    Right hydrocele    Past Surgical History:  Procedure Laterality Date   HYDROCELE EXCISION Right 12/05/2015   Procedure: RIGHT HYDROCELECTOMY ADULT;  Surgeon: Sebastian Acheheodore Manny, MD;  [facility] [place];  Service: Urology;  Laterality: Right;   ORCHIOPEXY Right 12/05/2015   Procedure: ORCHIOPEXY/ SPERMATOCELECTOMY  ADULT;  Surgeon: Sebastian Acheheodore Manny, MD;  [facility] [place];  Service: Urology;  Laterality: Right;   TONSILLECTOMY     WISDOM TOOTH EXTRACTION  Family History  Problem Relation Age of Onset   Diabetes Mother    Heart disease Mother        Valvular Cardiomyopathy   Pulmonary embolism Father        complication of amputation of leg for gangrene   Peripheral vascular disease Father    Alcohol abuse Father    Cancer Sister 32       colon   Bipolar disorder Sister    Cirrhosis Brother        alcoholic--had shunt   Lung cancer Brother        had removal of spot from lung   Alcohol abuse Brother        after death of son   Drug abuse Brother        Following death of son   Kidney disease Brother        not clear of cause:  exposure to something while in service in [place].  Peritoneal dialysis.  developed skin infection around catheter and died   Social History    Socioeconomic History   Marital status: Single    Spouse name: Not on file   Number of children: 0   Years of education: Not on file   Highest education level: Associate degree: occupational, Scientist, product/process development, or vocational program  Occupational History   Occupation: retired    Comment: Curator  Tobacco Use   Smoking status: Every Day    Packs/day: 0.50    Years: 35.00    Total pack years: 17.50    Types: Cigarettes   Smokeless tobacco: Never   Tobacco comments:    referral to quit line as not able to get him motivated previously  Vaping Use   Vaping Use: Never used  Substance and Sexual Activity   Alcohol use: Yes    Alcohol/week: 15.0 standard drinks of alcohol    Types: 15 Standard drinks or equivalent per week   Drug use: Yes    Types: Cocaine    Comment: No use for 1 year   Sexual activity: Not Currently  Other Topics Concern   Not on file  Social History Narrative   Lives with his mother in [place] neighborhood.   He is retired, but very active, helping friends and tinkering on cars.   Going to [place]. [place] [facility] prevention.  States he was at 6.4% with beginning of class this year.   Social Determinants of Health   Financial Resource Strain: Low Risk  (06/23/2018)   Overall Financial Resource Strain (CARDIA)    Difficulty of Paying Living Expenses: Not very hard  Food Insecurity: No Food Insecurity (06/23/2018)   Hunger Vital Sign    Worried About Running Out of Food in [place] Last Year: Never true    Ran Out of Food in [place] Last Year: Never true  Transportation Needs: No Transportation Needs (06/23/2018)   PRAPARE - Administrator, Civil Service (Medical): No    Lack of Transportation (Non-Medical): No  Physical Activity: Sufficiently Active (06/23/2018)   Exercise Vital Sign    Days of Exercise per Week: 7 days    Minutes of Exercise per Session: 120 min  Stress: Stress Concern Present (06/23/2018)   [employer] of Occupational Health -  Occupational Stress Questionnaire    Feeling of Stress : To some extent  Social Connections: Somewhat Isolated (06/23/2018)   Social Connection and Isolation Panel [NHANES]    Frequency of Communication with Friends and Family: More than three times  a week    Frequency of Social Gatherings with Friends and Family: More than three times a week    Attends Religious Services: Never    Database administrator or Organizations: Yes    Attends Engineer, structural: More than 4 times per year    Marital Status: Never married  Intimate Partner Violence: Not At Risk (06/23/2018)   Humiliation, Afraid, Rape, and Kick questionnaire    Fear of Current or Ex-Partner: No    Emotionally Abused: No    Physically Abused: No    Sexually Abused: No    Accidentally deleted ROS and could not bring it back up in chart..    Objective:   BP (!) 150/74 (BP Location: Right Arm, Patient Position: Sitting, Cuff Size: Normal)   Pulse 72   Resp 12   Ht 5\' 7"  (1.702 m)   Wt 180 lb (81.6 kg)   BMI 28.19 kg/m   Physical Exam HENT:     Head: Normocephalic and atraumatic.     Right Ear: Tympanic membrane, ear canal and external ear normal.     Left Ear: Tympanic membrane, ear canal and external ear normal.     Nose: Nose normal.     Mouth/Throat:     Mouth: Mucous membranes are moist.     Pharynx: Oropharynx is clear.  Eyes:     Extraocular Movements: Extraocular movements intact.     Conjunctiva/sclera: Conjunctivae normal.     Pupils: Pupils are equal, round, and reactive to light.     Comments: Discs sharp bilaterally.  Neck:     Thyroid: No thyroid mass or thyromegaly.  Cardiovascular:     Rate and Rhythm: Normal rate and regular rhythm.     Heart sounds: S1 normal and S2 normal. No murmur heard.    No friction rub. No S3 or S4 sounds.     Comments: No carotid bruits.  Carotid, radial, femoral, DP and PT pulses normal and equal.    Pulmonary:     Effort: Pulmonary effort is normal.      Breath sounds: Normal breath sounds.  Abdominal:     General: Bowel sounds are normal.     Palpations: Abdomen is soft. There is no hepatomegaly, splenomegaly or mass.     Tenderness: There is no abdominal tenderness.  Genitourinary:    Penis: Normal.      Testes:        Right: Testicular hydrocele present. Tenderness not present. Right testis is descended.        Left: Mass or tenderness not present. Left testis is descended.  Musculoskeletal:        General: Normal range of motion.     Cervical back: Normal range of motion and neck supple.     Right lower leg: 1+ Pitting Edema present.     Left lower leg: 1+ Pitting Edema present.  Feet:     Right foot:     Protective Sensation: 10 sites tested.  10 sites sensed.     Skin integrity: Skin integrity normal.     Left foot:     Protective Sensation: 10 sites tested.  10 sites sensed.     Skin integrity: Skin integrity normal.  Lymphadenopathy:     Head:     Right side of head: No submental or submandibular adenopathy.     Left side of head: No submental or submandibular adenopathy.     Cervical: No cervical adenopathy.     Upper  Body:     Right upper body: No supraclavicular or axillary adenopathy.     Left upper body: No supraclavicular or axillary adenopathy.     Lower Body: No right inguinal adenopathy. No left inguinal adenopathy.  Skin:    General: Skin is warm.     Capillary Refill: Capillary refill takes less than 2 seconds.     Findings: No rash.  Neurological:     General: No focal deficit present.     Mental Status: He is alert and oriented to person, place, and time.     Cranial Nerves: Cranial nerves 2-12 are intact.     Sensory: Sensation is intact.     Motor: Motor function is intact.     Coordination: Coordination is intact.     Gait: Gait is intact.     Deep Tendon Reflexes: Reflexes are normal and symmetric.  Psychiatric:        Mood and Affect: Mood normal.        Behavior: Behavior normal.   Assessment  & Plan   CPE PSA recently in normal range. Moderna For 2nd COVID boostser. Encouraged obtaining Shingrix #2 at pharmacy of choice as we currently do not carry. Encouraged influenza vaccine in fall. Needs Pneumococcal 23--currently out of stock.    2.  History of anemia and sister diagnosed with colon cancer age 65 Re-refer to [facility]. Not clear he will follow through.  3.  DM, diet controlled:  stable.  Send for diabetic eye exam.  4.  Hyperlipidemia:  Rosuvastatin 10 mg daily.  Follow up 6 weeks for FLP/hepatic profile.    5.  Hypertension with mild LE edema:  to use Furosemide more frequently and avoid salt.  BP check when in for fasting labs in 6 weeks.  6.  Right hydrocele:  referral back to Urology.  7.  Tobacco abuse:  encouraged getting started on cessation support.  Not interested.

## 2021-07-10 ENCOUNTER — Telehealth: Payer: Self-pay | Admitting: Internal Medicine

## 2021-07-13 NOTE — Telephone Encounter (Signed)
Pt notified that we could not get any record of cholesterol medication at [facility]. Notified to start Rosuvastatin.

## 2021-08-20 ENCOUNTER — Other Ambulatory Visit: Payer: Self-pay

## 2021-08-20 ENCOUNTER — Other Ambulatory Visit (INDEPENDENT_AMBULATORY_CARE_PROVIDER_SITE_OTHER): Payer: Medicare Other

## 2021-08-20 VITALS — BP 112/58 | HR 60

## 2021-08-20 DIAGNOSIS — Z79899 Other long term (current) drug therapy: Secondary | ICD-10-CM | POA: Diagnosis not present

## 2021-08-20 DIAGNOSIS — E782 Mixed hyperlipidemia: Secondary | ICD-10-CM

## 2021-08-20 DIAGNOSIS — Z0130 Encounter for examination of blood pressure without abnormal findings: Secondary | ICD-10-CM | POA: Diagnosis not present

## 2021-08-21 LAB — LIPID PANEL W/O CHOL/HDL RATIO
Cholesterol, Total: 168 mg/dL (ref 100–199)
HDL: 51 mg/dL (ref >39)
LDL Chol Calc (NIH): 95 mg/dL (ref 0–99)
Triglycerides: 125 mg/dL (ref 0–149)
VLDL Cholesterol Cal: 22 mg/dL (ref 5–40)

## 2021-08-21 LAB — HEPATIC FUNCTION PANEL
ALT: 14 IU/L (ref 0–44)
AST: 22 IU/L (ref 0–40)
Albumin: 4.5 g/dL (ref 3.8–4.8)
Alkaline Phosphatase: 127 IU/L — ABNORMAL HIGH (ref 44–121)
Bilirubin Total: 0.4 mg/dL (ref 0.0–1.2)
Bilirubin, Direct: 0.13 mg/dL (ref 0.00–0.40)
Total Protein: 7.3 g/dL (ref 6.0–8.5)

## 2021-08-22 ENCOUNTER — Other Ambulatory Visit: Payer: Self-pay | Admitting: Internal Medicine

## 2021-08-25 NOTE — Addendum Note (Signed)
Addended by: Duayne Cal on: 08/25/2021 03:32 PM   Modules accepted: Level of Service

## 2021-08-25 NOTE — Progress Notes (Signed)
BP at good level.  No change to medication.

## 2021-10-12 ENCOUNTER — Encounter: Payer: Self-pay | Admitting: Internal Medicine

## 2021-10-12 DIAGNOSIS — E119 Type 2 diabetes mellitus without complications: Secondary | ICD-10-CM | POA: Insufficient documentation

## 2022-01-12 ENCOUNTER — Ambulatory Visit: Payer: Medicare Other | Admitting: Internal Medicine

## 2022-01-19 ENCOUNTER — Other Ambulatory Visit: Payer: Medicare Other

## 2022-07-29 ENCOUNTER — Other Ambulatory Visit: Payer: Self-pay | Admitting: Internal Medicine

## 2022-08-12 ENCOUNTER — Other Ambulatory Visit: Payer: Self-pay | Admitting: Internal Medicine

## 2022-08-16 ENCOUNTER — Encounter: Payer: Self-pay | Admitting: Internal Medicine

## 2022-08-16 DIAGNOSIS — Z800 Family history of malignant neoplasm of digestive organs: Secondary | ICD-10-CM | POA: Insufficient documentation

## 2022-10-07 ENCOUNTER — Ambulatory Visit (INDEPENDENT_AMBULATORY_CARE_PROVIDER_SITE_OTHER): Payer: Medicare Other | Admitting: Internal Medicine

## 2022-10-07 ENCOUNTER — Encounter: Payer: Self-pay | Admitting: Internal Medicine

## 2022-10-07 VITALS — BP 130/60 | HR 60 | Resp 12 | Ht 67.0 in | Wt 177.0 lb

## 2022-10-07 DIAGNOSIS — I10 Essential (primary) hypertension: Secondary | ICD-10-CM

## 2022-10-07 DIAGNOSIS — Z122 Encounter for screening for malignant neoplasm of respiratory organs: Secondary | ICD-10-CM

## 2022-10-07 DIAGNOSIS — E119 Type 2 diabetes mellitus without complications: Secondary | ICD-10-CM | POA: Diagnosis not present

## 2022-10-07 DIAGNOSIS — E782 Mixed hyperlipidemia: Secondary | ICD-10-CM

## 2022-10-07 DIAGNOSIS — Z23 Encounter for immunization: Secondary | ICD-10-CM | POA: Diagnosis not present

## 2022-10-07 DIAGNOSIS — F17200 Nicotine dependence, unspecified, uncomplicated: Secondary | ICD-10-CM

## 2022-10-07 DIAGNOSIS — Z720 Tobacco use: Secondary | ICD-10-CM

## 2022-10-07 DIAGNOSIS — Z716 Tobacco abuse counseling: Secondary | ICD-10-CM

## 2022-10-07 NOTE — Patient Instructions (Signed)
Please obtain new COVID vaccine at pharmacy of choice Please obtain pneumococcal 20 at pharmacy of choice.

## 2022-10-07 NOTE — Progress Notes (Signed)
    Subjective:    Patient ID: [place] Stafford, male   DOB: [birth year], 71 y.o.   MRN: [id number]   HPI  Back after not being seen since 06/2021 Has been taking care of his sister with colon cancer, who is in remission and driving his mother around.     Hypertension:  tolerating Toprol fine.    2.  Hyperlipidemia:  has been controlled with Rosuvastatin.  Non fasting today.    3.  DM:  Not checking his blood glucose.  Previous 2 years at 6.5%.  Diet controlled.  Did get eye exam in June or July.  No diabetic changes.    4.  HM:  Needs influenza, COVID, Shingrix, Pneumococcal 20.  Have made multiple attempts to get him in for screening colonoscopy.  He states he has not set up when contacted as does not have time.  Discussed concern with his sister's diagnosis.    5.  Tobacco abuse:  1/2 ppd.   Trying to cut down.  No interest in support for cessation.      Current Meds  Medication Sig   metoprolol succinate (TOPROL-XL) 50 MG 24 hr tablet TAKE 1 TABLET BY MOUTH ONCE DAILY WITH  OR  IMMEDIATELY  FOLLOWING  A  MEAL   rosuvastatin (CRESTOR) 10 MG tablet TAKE 1 TABLET BY MOUTH ONCE DAILY WITH MEALS   Allergies  Allergen Reactions   Lisinopril     Complaint of tongue swelling never observed by medical personnel.  Switching to a different bp med with angioedema as possibility     Review of Systems    Objective:   BP 130/60 (BP Location: Right Arm, Patient Position: Sitting, Cuff Size: Normal)   Pulse 60   Resp 12   Ht 5\' 7"  (1.702 m)   Wt 177 lb (80.3 kg)   BMI 27.72 kg/m   Physical Exam NAD HEENT:  PERRL, EOMI, TMs pearly gray, throat without injection Neck:  Supple, No adenopathy, no thyromegaly Chest:  Scattered expiratory wheeze, but with good air movement.   CV:  RRR with normal S1 andS2, No S3, S4 or murmur.  No carotid bruit.  Carotid, radial and DP pulses normal and equal Abd:  S, NT, No HSM or mass, + BS LE:  No edema.  Assessment & Plan   Hypertension:   control fine with Toprol XL  2.  Hyperlipidemia:  Continue Rosuvastatin.  FLP in next week.    3.  DM:  A1C, urine microalbumin/crea, CMP in next week.  4.  HM:  PSA and CBC, return FIT in next week.  Fluad influenza vaccine.  He already has Shingles vaccination in [place].  Encouraged him to obtain new COVID and Pneumococcal 20 at pharmacy of choice.   He will let me know when ready to have colonoscopy done.  CBC  5.  Tobacco abuse:  meets criteria for low dose CT of chest screen for lung CA.  Encouraged him to think more seriously about tobacco cessation. Will let me know when ready to refer for colonoscopy again--urged him to get done soon with sister's diagnosis Urged to consider tobacco cessation support--will let me know.

## 2022-10-14 ENCOUNTER — Other Ambulatory Visit (INDEPENDENT_AMBULATORY_CARE_PROVIDER_SITE_OTHER): Payer: Medicare Other

## 2022-10-14 DIAGNOSIS — Z125 Encounter for screening for malignant neoplasm of prostate: Secondary | ICD-10-CM | POA: Diagnosis not present

## 2022-10-14 DIAGNOSIS — Z1211 Encounter for screening for malignant neoplasm of colon: Secondary | ICD-10-CM | POA: Diagnosis not present

## 2022-10-14 DIAGNOSIS — E119 Type 2 diabetes mellitus without complications: Secondary | ICD-10-CM | POA: Diagnosis not present

## 2022-10-14 DIAGNOSIS — E782 Mixed hyperlipidemia: Secondary | ICD-10-CM | POA: Diagnosis not present

## 2022-10-14 DIAGNOSIS — R195 Other fecal abnormalities: Secondary | ICD-10-CM

## 2022-10-14 DIAGNOSIS — Z79899 Other long term (current) drug therapy: Secondary | ICD-10-CM | POA: Diagnosis not present

## 2022-10-14 LAB — POC FIT TEST STOOL: Fecal Occult Blood: POSITIVE

## 2022-10-14 NOTE — Addendum Note (Signed)
Addended by: Mariah Milling on: 10/14/2022 04:48 PM   Modules accepted: Orders

## 2022-10-14 NOTE — Addendum Note (Signed)
Addended by: Marcelino Duster on: 10/14/2022 05:26 PM   Modules accepted: Orders

## 2022-10-15 LAB — CBC WITH DIFFERENTIAL/PLATELET
Basophils Absolute: 0 10*3/uL (ref 0.0–0.2)
Basos: 1 %
EOS (ABSOLUTE): 0.3 10*3/uL (ref 0.0–0.4)
Eos: 5 %
Hematocrit: 37.3 % — ABNORMAL LOW (ref 37.5–51.0)
Hemoglobin: 12.2 g/dL — ABNORMAL LOW (ref 13.0–17.7)
Immature Grans (Abs): 0 10*3/uL (ref 0.0–0.1)
Immature Granulocytes: 0 %
Lymphocytes Absolute: 1.6 10*3/uL (ref 0.7–3.1)
Lymphs: 24 %
MCH: 29.4 pg (ref 26.6–33.0)
MCHC: 32.7 g/dL (ref 31.5–35.7)
MCV: 90 fL (ref 79–97)
Monocytes Absolute: 0.5 10*3/uL (ref 0.1–0.9)
Monocytes: 8 %
Neutrophils Absolute: 4.3 10*3/uL (ref 1.4–7.0)
Neutrophils: 62 %
Platelets: 238 10*3/uL (ref 150–450)
RBC: 4.15 x10E6/uL (ref 4.14–5.80)
RDW: 13 % (ref 11.6–15.4)
WBC: 6.8 10*3/uL (ref 3.4–10.8)

## 2022-10-15 LAB — COMPREHENSIVE METABOLIC PANEL
ALT: 17 IU/L (ref 0–44)
AST: 27 IU/L (ref 0–40)
Albumin/Globulin Ratio: 1.5 (ref 1.2–2.2)
Albumin: 4.1 g/dL (ref 3.8–4.8)
Alkaline Phosphatase: 106 IU/L (ref 44–121)
BUN/Creatinine Ratio: 12 (ref 10–24)
BUN: 14 mg/dL (ref 8–27)
Bilirubin Total: 0.3 mg/dL (ref 0.0–1.2)
CO2: 20 mmol/L (ref 20–29)
Calcium: 8.6 mg/dL (ref 8.6–10.2)
Chloride: 103 mmol/L (ref 96–106)
Creatinine, Ser: 1.17 mg/dL (ref 0.76–1.27)
Globulin, Total: 2.7 g/dL (ref 1.5–4.5)
Glucose: 100 mg/dL — ABNORMAL HIGH (ref 70–99)
Potassium: 4.6 mmol/L (ref 3.5–5.2)
Sodium: 141 mmol/L (ref 134–144)
Total Protein: 6.8 g/dL (ref 6.0–8.5)
eGFR: 67 mL/min/{1.73_m2} (ref >59)

## 2022-10-15 LAB — HEMOGLOBIN A1C
Est. average glucose Bld gHb Est-mCnc: 134 mg/dL
Hgb A1c MFr Bld: 6.3 % — ABNORMAL HIGH (ref 4.8–5.6)

## 2022-10-15 LAB — LIPID PANEL W/O CHOL/HDL RATIO
Cholesterol, Total: 117 mg/dL (ref 100–199)
HDL: 45 mg/dL (ref >39)
LDL Chol Calc (NIH): 54 mg/dL (ref 0–99)
Triglycerides: 92 mg/dL (ref 0–149)
VLDL Cholesterol Cal: 18 mg/dL (ref 5–40)

## 2022-10-15 LAB — PSA: Prostate Specific Ag, Serum: 0.2 ng/mL (ref 0.0–4.0)

## 2022-12-03 ENCOUNTER — Other Ambulatory Visit: Payer: Self-pay | Admitting: Internal Medicine

## 2022-12-13 ENCOUNTER — Other Ambulatory Visit: Payer: Self-pay | Admitting: Internal Medicine

## 2023-01-28 ENCOUNTER — Encounter: Payer: Self-pay | Admitting: Internal Medicine

## 2023-04-12 ENCOUNTER — Encounter: Payer: Self-pay | Admitting: Internal Medicine

## 2023-04-12 ENCOUNTER — Ambulatory Visit: Payer: Medicare Other | Admitting: Internal Medicine

## 2023-04-12 VITALS — BP 160/82 | HR 80 | Resp 16 | Ht 67.0 in | Wt 166.5 lb

## 2023-04-12 DIAGNOSIS — Z87891 Personal history of nicotine dependence: Secondary | ICD-10-CM

## 2023-04-12 DIAGNOSIS — L2082 Flexural eczema: Secondary | ICD-10-CM

## 2023-04-12 DIAGNOSIS — D649 Anemia, unspecified: Secondary | ICD-10-CM | POA: Diagnosis not present

## 2023-04-12 DIAGNOSIS — F17200 Nicotine dependence, unspecified, uncomplicated: Secondary | ICD-10-CM | POA: Diagnosis not present

## 2023-04-12 DIAGNOSIS — J449 Chronic obstructive pulmonary disease, unspecified: Secondary | ICD-10-CM

## 2023-04-12 DIAGNOSIS — I10 Essential (primary) hypertension: Secondary | ICD-10-CM

## 2023-04-12 DIAGNOSIS — Z23 Encounter for immunization: Secondary | ICD-10-CM | POA: Diagnosis not present

## 2023-04-12 DIAGNOSIS — E119 Type 2 diabetes mellitus without complications: Secondary | ICD-10-CM

## 2023-04-12 DIAGNOSIS — Z0000 Encounter for general adult medical examination without abnormal findings: Secondary | ICD-10-CM

## 2023-04-12 DIAGNOSIS — R0989 Other specified symptoms and signs involving the circulatory and respiratory systems: Secondary | ICD-10-CM

## 2023-04-12 DIAGNOSIS — Z716 Tobacco abuse counseling: Secondary | ICD-10-CM

## 2023-04-12 DIAGNOSIS — R195 Other fecal abnormalities: Secondary | ICD-10-CM

## 2023-04-12 DIAGNOSIS — E782 Mixed hyperlipidemia: Secondary | ICD-10-CM

## 2023-04-12 DIAGNOSIS — N433 Hydrocele, unspecified: Secondary | ICD-10-CM

## 2023-04-12 MED ORDER — NICOTINE 14 MG/24HR TD PT24: MEDICATED_PATCH | TRANSDERMAL | 0 refills | Status: DC

## 2023-04-12 MED ORDER — NICOTINE 7 MG/24HR TD PT24: MEDICATED_PATCH | TRANSDERMAL | 0 refills | Status: DC

## 2023-04-12 MED ORDER — NICOTINE 21-14-7 MG/24HR TD KIT: PACK | TRANSDERMAL | 0 refills | Status: DC

## 2023-04-12 MED ORDER — NICOTINE 21 MG/24HR TD PT24: 21.0000 mg | MEDICATED_PATCH | Freq: Every day | TRANSDERMAL | 0 refills | Status: DC

## 2023-04-12 MED ORDER — TRIAMCINOLONE ACETONIDE 0.1 % EX CREA
1.0000 | TOPICAL_CREAM | Freq: Two times a day (BID) | CUTANEOUS | 2 refills | Status: DC
Start: 2023-04-12 — End: 2023-07-05

## 2023-04-12 NOTE — Patient Instructions (Signed)
Tobacco Cessation:   [id number] or [phone], the former for support and possibly free nicotine patches/gum and support; the latter for [facility] Smoking cessation class. Get rid of all smoking supplies:  Cigarettes, lighters, ashtrays--no stashes just in case at home if you are serious.For nicotine patches:  Stop smoking anything the day you start the first patch Start with 21 mg patch and reapply new to different area of skin every 24 hours for 28 days. Then 14 mg patch changed every 24 hours for 14 days. Then 7 mg patch changed every 24 hours for 14 days.  

## 2023-04-12 NOTE — Progress Notes (Signed)
Subjective:    Patient ID: Jeffrey Stafford, male   DOB: 07/08/1951, 72 y.o.   MRN: [id number]   HPI  Here for Male CPE:  1.  STE:  Fluid back around right testicle.  Causing discomfort.  Previously seen and drained by [facility].  No family history of testicular cancer.    2.  PSA: Last checked 09/2022 and normal at 0.2.  No family history of prostate cancer.    3.  Guaiac Cards/FIT:  Last checked with FIT in 09/2022 and positive.    4.  Colonoscopy: Still has not made his appt for colonoscopy.  States he needs to check with his friend to see if he can take him for his appts for this.    5.  Cholesterol/Glucose:  Cholesterol and A1C at goal in October.  A1C at 6.3%. Lipid Panel     Component Value Date/Time   CHOL 117 10/14/2022 0955   TRIG 92 10/14/2022 0955   HDL 45 10/14/2022 0955   LDLCALC 54 10/14/2022 0955   LABVLDL 18 10/14/2022 0955     6.  Immunizations:  Has not had most recent COVID booster.  He has not had a Pneumococcal 20 yet.   Immunization History  Administered Date(s) Administered   Fluad Quad(high Dose 65+) 10/07/2022   Influenza Inj Mdck Quad Pf 12/02/2017   Influenza, High Dose Seasonal PF 09/24/2018, 10/05/2019   Moderna SARS-COV2 Booster Vaccination 07/09/2021   Moderna Sars-Covid-2 Vaccination 02/25/2020, 03/24/2020, 12/29/2020   Pneumococcal Conjugate-13 10/05/2019   Tdap 10/05/2019   Zoster Recombinat (Shingrix) 10/05/2019, 12/10/2019     No outpatient medications have been marked as taking for the 04/12/23 encounter (Office Visit) with Julieanne Manson, MD.   Allergies  Allergen Reactions   Lisinopril     Complaint of tongue swelling never observed by medical personnel.  Switching to a different bp med with angioedema as possibility   Past Medical History:  Diagnosis Date   Allergy    seasonal allergies   Anemia    hx of   Anxiety    hx of   Arthritis    generalized   Cocaine substance abuse 2014-2015   Depression     hx of   DM (diabetes mellitus), type 2    followed by pcp-- last A1c 6.1 on 05-09-2017 in epic   Eczema    GERD (gastroesophageal reflux disease)    with certain foods   Hyperlipidemia    not taking meds at this time   Hypertension    not taking meds at this time   Nocturia    Right hydrocele    Past Surgical History:  Procedure Laterality Date   HYDROCELE EXCISION Right 12/05/2015   Procedure: RIGHT HYDROCELECTOMY ADULT;  Surgeon: Sebastian Ache, MD;  Location: [facility];  Service: Urology;  Laterality: Right;   ORCHIOPEXY Right 12/05/2015   Procedure: ORCHIOPEXY/ SPERMATOCELECTOMY  ADULT;  Surgeon: Sebastian Ache, MD;  Location: [facility];  Service: Urology;  Laterality: Right;   TONSILLECTOMY     WISDOM TOOTH EXTRACTION     Family History  Problem Relation Age of Onset   Stroke Mother    Diabetes Mother    Heart disease Mother        Valvular Cardiomyopathy   Pulmonary embolism Father        complication of amputation of leg for gangrene   Peripheral vascular disease Father    Alcohol abuse Father    Cancer Sister  69       colon:  2023 in remission   Bipolar disorder Sister    Cirrhosis Brother        alcoholic--had shunt   Lung cancer Brother        had removal of spot from lung   Alcohol abuse Brother        after death of son   Drug abuse Brother        Following death of son   Kidney disease Brother        not clear of cause:  exposure to something while in service in [place].  Peritoneal dialysis.  developed skin infection around catheter and died   Social History   Socioeconomic History   Marital status: Single    Spouse name: Not on file   Number of children: 0   Years of education: Not on file   Highest education level: Associate degree: occupational, Scientist, product/process development, or vocational program  Occupational History   Occupation: retired    Comment: Curator  Tobacco Use   Smoking status: Every Day    Packs/day: 0.50     Years: 35.00    Additional pack years: 0.00    Total pack years: 17.50    Types: Cigarettes   Smokeless tobacco: Never   Tobacco comments:    Ordered patches today. 04/12/2023  Vaping Use   Vaping Use: Never used  Substance and Sexual Activity   Alcohol use: Not Currently    Comment: once weekly drinks a shot of liquor   Drug use: Yes    Types: Cocaine    Comment: No use since 2022   Sexual activity: Not Currently  Other Topics Concern   Not on file  Social History Narrative   Lives with his mother in [place] neighborhood.   He is retired, but very active, helping friends and tinkering on cars.   Social Determinants of Health   Financial Resource Strain: Low Risk  (04/12/2023)   Overall Financial Resource Strain (CARDIA)    Difficulty of Paying Living Expenses: Not hard at all  Food Insecurity: No Food Insecurity (04/12/2023)   Hunger Vital Sign    Worried About Running Out of Food in the Last Year: Never true    Ran Out of Food in the Last Year: Never true  Transportation Needs: No Transportation Needs (04/12/2023)   PRAPARE - Administrator, Civil Service (Medical): No    Lack of Transportation (Non-Medical): No  Physical Activity: Sufficiently Active (06/23/2018)   Exercise Vital Sign    Days of Exercise per Week: 7 days    Minutes of Exercise per Session: 120 min  Stress: Stress Concern Present (06/23/2018)   [employer] of Occupational Health - Occupational Stress Questionnaire    Feeling of Stress : To some extent  Social Connections: Somewhat Isolated (06/23/2018)   Social Connection and Isolation Panel [NHANES]    Frequency of Communication with Friends and Family: More than three times a week    Frequency of Social Gatherings with Friends and Family: More than three times a week    Attends Religious Services: Never    Database administrator or Organizations: Yes    Attends Engineer, structural: More than 4 times per year    Marital  Status: Never married  Intimate Partner Violence: Not At Risk (04/12/2023)   Humiliation, Afraid, Rape, and Kick questionnaire    Fear of Current or Ex-Partner: No    Emotionally Abused: No  Physically Abused: No    Sexually Abused: No      Review of Systems  HENT:  Negative for tinnitus.   Eyes:  Negative for visual disturbance (Due for eye exam in June with Dr. Dione Booze.).  Respiratory:  Negative for shortness of breath.   Cardiovascular:  Negative for chest pain, palpitations and leg swelling.  Gastrointestinal:  Negative for blood in stool (No melena).  Skin:  Positive for rash (Volar wrists--used latex gloves prior.  Cortisone 10 helps with itch.).  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       Objective:   BP (!) 160/82 (BP Location: Right Arm, Patient Position: Sitting, Cuff Size: Normal)   Pulse 80   Resp 16   Ht 5\' 7"  (1.702 m)   Wt 166 lb 8 oz (75.5 kg)   BMI 26.08 kg/m   Physical Exam HENT:     Head: Normocephalic and atraumatic.     Right Ear: Tympanic membrane, ear canal and external ear normal.     Left Ear: Tympanic membrane, ear canal and external ear normal.     Nose: Nose normal.     Mouth/Throat:     Mouth: Mucous membranes are moist.     Pharynx: Oropharynx is clear.     Comments: Edentulous. Eyes:     Extraocular Movements: Extraocular movements intact.     Conjunctiva/sclera: Conjunctivae normal.     Pupils: Pupils are equal, round, and reactive to light.     Comments: Unable to see discs well.    Neck:     Thyroid: No thyroid mass or thyromegaly.  Cardiovascular:     Rate and Rhythm: Normal rate and regular rhythm.     Heart sounds: S1 normal and S2 normal. No murmur heard.    No friction rub. No S3 or S4 sounds.     Comments: No carotid bruits.  Carotid, radial, femoral, PT pulses normal and equal.  Equal left DP pulse, but unable to find a  good right DP pulse.  Pulmonary:     Effort: Pulmonary effort is normal.     Breath sounds: Decreased air movement present. Wheezing (Scattered.) and rhonchi (Scattered) present.  Abdominal:     General: Abdomen is flat. Bowel sounds are normal.     Palpations: Abdomen is soft. There is no hepatomegaly, splenomegaly or mass.     Tenderness: There is no abdominal tenderness.  Genitourinary:    Penis: Normal and uncircumcised.      Testes:        Right: Mass (Right large cystic lesion, seems separate from testicle.  Smooth and nontender.) present. Tenderness not present. Right testis is descended.        Left: Mass or tenderness not present. Left testis is descended.     Comments: Right large cystic lesion, seems separate from testicle.  Smooth and nontender.  Musculoskeletal:        General: Normal range of motion.     Cervical back: Normal range of motion and neck supple.     Right lower leg: No edema.     Left lower leg: No edema.  Feet:     Comments: Diabetic Foot Exam - Simple   Simple Foot Form Diabetic Foot exam was performed with the following findings: Yes  04/12/2023 10:46 AM  Visual Inspection No deformities, no ulcerations, no other skin breakdown bilaterally: Yes Sensation Testing Intact to touch and monofilament testing  bilaterally: Yes Pulse Check See comments: Yes Comments Decreased right DP pulse    Lymphadenopathy:     Head:     Right side of head: No submental or submandibular adenopathy.     Left side of head: No submental or submandibular adenopathy.     Cervical: No cervical adenopathy.     Upper Body:     Right upper body: No supraclavicular adenopathy.     Left upper body: No supraclavicular adenopathy.     Lower Body: No right inguinal adenopathy. No left inguinal adenopathy.  Skin:    General: Skin is warm.     Capillary Refill: Capillary refill takes less than 2 seconds.     Comments: Hyperpigmented dry flaking skin with deep skin folds of volar  wrists bilaterally.  Neurological:     General: No focal deficit present.     Mental Status: He is alert and oriented to person, place, and time.     Cranial Nerves: Cranial nerves 2-12 are intact.     Sensory: Sensation is intact.     Motor: Motor function is intact.     Coordination: Coordination is intact.     Gait: Gait is intact.     Deep Tendon Reflexes: Reflexes are normal and symmetric.  Psychiatric:        Speech: Speech normal.        Behavior: Behavior normal. Behavior is cooperative.     Assessment & Plan   CPE Fasting labs Pneumococcal 20. Encouraged him to get his COVID booster still.  2.  + FIT with recurrent anemia:  Strongly urged him to go to an initial GI appt and then find dates his friend can take him for colonoscopy/possible EGD.  CBC  3.  COPD/Tobacco use disorder: 35 pack year history:  Limited CT of lungs.  Start nicotine patches.  Discussed setting himself up for success by getting rid of smoking paraphernalia.  4.  Hypertension:  get back on Metoprolol XL 50 mg daily..  CBC, CMP  5.  Hyperlipidemia:  restart Rosuvastatin.  FLP  6.  DM, diet controlled:  A1C, urine microalbumin/crea  7.  Decreased L DP pulse:  Arterial dopplers/ABI  8.  Right scrotal mass:   re refer to [facility].

## 2023-04-13 LAB — COMPREHENSIVE METABOLIC PANEL
ALT: 19 IU/L (ref 0–44)
AST: 51 IU/L — ABNORMAL HIGH (ref 0–40)
Albumin/Globulin Ratio: 1.4 (ref 1.2–2.2)
Alkaline Phosphatase: 141 IU/L — ABNORMAL HIGH (ref 44–121)
CO2: 20 mmol/L (ref 20–29)
Calcium: 9.1 mg/dL (ref 8.6–10.2)
Chloride: 100 mmol/L (ref 96–106)
eGFR: 48 mL/min/{1.73_m2} — ABNORMAL LOW (ref >59)

## 2023-04-13 LAB — CBC WITH DIFFERENTIAL/PLATELET
Platelets: 302 10*3/uL (ref 150–450)
WBC: 6.6 10*3/uL (ref 3.4–10.8)

## 2023-04-13 LAB — MICROALBUMIN / CREATININE URINE RATIO

## 2023-04-13 LAB — HGB A1C W/O EAG: Hgb A1c MFr Bld: 6.4 % — ABNORMAL HIGH (ref 4.8–5.6)

## 2023-04-13 LAB — LIPID PANEL W/O CHOL/HDL RATIO: HDL: 48 mg/dL (ref >39)

## 2023-04-14 LAB — COMPREHENSIVE METABOLIC PANEL
Albumin: 4.2 g/dL (ref 3.8–4.8)
BUN/Creatinine Ratio: 14 (ref 10–24)
BUN: 21 mg/dL (ref 8–27)
Bilirubin Total: 0.5 mg/dL (ref 0.0–1.2)
Creatinine, Ser: 1.54 mg/dL — ABNORMAL HIGH (ref 0.76–1.27)
Globulin, Total: 3.1 g/dL (ref 1.5–4.5)
Glucose: 100 mg/dL — ABNORMAL HIGH (ref 70–99)
Potassium: 4.2 mmol/L (ref 3.5–5.2)
Sodium: 138 mmol/L (ref 134–144)
Total Protein: 7.3 g/dL (ref 6.0–8.5)

## 2023-04-14 LAB — CBC WITH DIFFERENTIAL/PLATELET
Basophils Absolute: 0.1 10*3/uL (ref 0.0–0.2)
Basos: 1 %
EOS (ABSOLUTE): 0.6 10*3/uL — ABNORMAL HIGH (ref 0.0–0.4)
Eos: 8 %
Hematocrit: 39.6 % (ref 37.5–51.0)
Hemoglobin: 13 g/dL (ref 13.0–17.7)
Immature Grans (Abs): 0 10*3/uL (ref 0.0–0.1)
Immature Granulocytes: 0 %
Lymphocytes Absolute: 1.9 10*3/uL (ref 0.7–3.1)
Lymphs: 29 %
MCH: 28.4 pg (ref 26.6–33.0)
MCHC: 32.8 g/dL (ref 31.5–35.7)
MCV: 87 fL (ref 79–97)
Monocytes Absolute: 0.6 10*3/uL (ref 0.1–0.9)
Monocytes: 9 %
Neutrophils Absolute: 3.5 10*3/uL (ref 1.4–7.0)
Neutrophils: 53 %
RBC: 4.58 x10E6/uL (ref 4.14–5.80)
RDW: 12.9 % (ref 11.6–15.4)

## 2023-04-14 LAB — LIPID PANEL W/O CHOL/HDL RATIO
Cholesterol, Total: 207 mg/dL — ABNORMAL HIGH (ref 100–199)
LDL Chol Calc (NIH): 137 mg/dL — ABNORMAL HIGH (ref 0–99)
Triglycerides: 122 mg/dL (ref 0–149)
VLDL Cholesterol Cal: 22 mg/dL (ref 5–40)

## 2023-04-14 LAB — MICROALBUMIN / CREATININE URINE RATIO: Microalb/Creat Ratio: 18 mg/g creat (ref 0–29)

## 2023-04-26 ENCOUNTER — Other Ambulatory Visit (INDEPENDENT_AMBULATORY_CARE_PROVIDER_SITE_OTHER): Payer: Medicare Other

## 2023-04-26 VITALS — BP 160/68 | HR 64

## 2023-04-26 DIAGNOSIS — Z0130 Encounter for examination of blood pressure without abnormal findings: Secondary | ICD-10-CM

## 2023-04-26 NOTE — Progress Notes (Unsigned)
Patient reports that he is taking bp medication consistently. Patient has not taking bp medication this morning.    Patient returned after taking his medication to repeat bp check.

## 2023-04-27 ENCOUNTER — Ambulatory Visit
Admission: RE | Admit: 2023-04-27 | Discharge: 2023-04-27 | Disposition: A | Discharge status: Home / Self Care | Payer: Medicare Other | Source: Ambulatory Visit | Attending: Internal Medicine | Admitting: Internal Medicine

## 2023-04-27 DIAGNOSIS — R0989 Other specified symptoms and signs involving the circulatory and respiratory systems: Secondary | ICD-10-CM

## 2023-04-27 MED ORDER — METOPROLOL SUCCINATE ER 25 MG PO TB24: ORAL_TABLET | ORAL | 11 refills | Status: DC

## 2023-04-27 NOTE — Progress Notes (Signed)
Increase Metoprolol to 75 mg daily with an extra 25 mg tab and repeat BP and pulse in 2 weeks please

## 2023-05-16 ENCOUNTER — Ambulatory Visit
Admission: RE | Admit: 2023-05-16 | Discharge: 2023-05-16 | Disposition: A | Discharge status: Home / Self Care | Payer: Medicare Other | Source: Ambulatory Visit | Attending: Internal Medicine | Admitting: Internal Medicine

## 2023-05-16 DIAGNOSIS — F17200 Nicotine dependence, unspecified, uncomplicated: Secondary | ICD-10-CM

## 2023-05-16 DIAGNOSIS — Z87891 Personal history of nicotine dependence: Secondary | ICD-10-CM

## 2023-05-17 ENCOUNTER — Ambulatory Visit (INDEPENDENT_AMBULATORY_CARE_PROVIDER_SITE_OTHER): Payer: Medicare Other

## 2023-05-17 VITALS — BP 150/100 | HR 104

## 2023-05-17 DIAGNOSIS — Z0130 Encounter for examination of blood pressure without abnormal findings: Secondary | ICD-10-CM | POA: Diagnosis not present

## 2023-05-17 NOTE — Progress Notes (Unsigned)
Patient reports that he has been taking bp medication consistently. No issues with medication to report.   Patient would like to wait

## 2023-05-18 ENCOUNTER — Ambulatory Visit (INDEPENDENT_AMBULATORY_CARE_PROVIDER_SITE_OTHER): Payer: Medicare Other | Admitting: Internal Medicine

## 2023-05-18 VITALS — BP 118/64 | HR 72

## 2023-05-18 DIAGNOSIS — Z0130 Encounter for examination of blood pressure without abnormal findings: Secondary | ICD-10-CM | POA: Diagnosis not present

## 2023-05-18 NOTE — Progress Notes (Signed)
Looks good this time. No change to meds

## 2023-05-18 NOTE — Progress Notes (Signed)
Patient reported that his is taking bp medication consistently and took medications this morning.

## 2023-07-05 ENCOUNTER — Ambulatory Visit: Payer: Medicare Other | Admitting: Internal Medicine

## 2023-07-05 ENCOUNTER — Encounter: Payer: Self-pay | Admitting: Internal Medicine

## 2023-07-05 VITALS — BP 160/70 | HR 60 | Resp 12 | Ht 67.0 in | Wt 175.0 lb

## 2023-07-05 DIAGNOSIS — F439 Reaction to severe stress, unspecified: Secondary | ICD-10-CM | POA: Diagnosis not present

## 2023-07-05 DIAGNOSIS — R195 Other fecal abnormalities: Secondary | ICD-10-CM | POA: Diagnosis not present

## 2023-07-05 DIAGNOSIS — F17200 Nicotine dependence, unspecified, uncomplicated: Secondary | ICD-10-CM

## 2023-07-05 DIAGNOSIS — Z23 Encounter for immunization: Secondary | ICD-10-CM | POA: Diagnosis not present

## 2023-07-05 DIAGNOSIS — F32A Depression, unspecified: Secondary | ICD-10-CM

## 2023-07-05 DIAGNOSIS — D649 Anemia, unspecified: Secondary | ICD-10-CM | POA: Diagnosis not present

## 2023-07-05 DIAGNOSIS — I10 Essential (primary) hypertension: Secondary | ICD-10-CM

## 2023-07-05 DIAGNOSIS — E782 Mixed hyperlipidemia: Secondary | ICD-10-CM

## 2023-07-05 DIAGNOSIS — R0989 Other specified symptoms and signs involving the circulatory and respiratory systems: Secondary | ICD-10-CM

## 2023-07-05 MED ORDER — TRIAMCINOLONE ACETONIDE 0.1 % EX CREA: 1.0000 | TOPICAL_CREAM | Freq: Two times a day (BID) | CUTANEOUS | 2 refills | Status: AC

## 2023-07-05 MED ORDER — ROSUVASTATIN CALCIUM 10 MG PO TABS: ORAL_TABLET | ORAL | 3 refills | Status: DC

## 2023-07-05 NOTE — Progress Notes (Signed)
    Subjective:    Patient ID: Jeffrey Stafford, male   DOB: [birth year], 72 y.o.   MRN: [id number]   HPI  +FIT with recurrent anemia:  has not been able to get his friend to be with him for any of the procedures recommended--including diagnostic colonoscopy.  States he could go ahead and make the appt for the colonoscopy and then check with his friend to see if he can go.   He has been referred yearly for this since 2020.     2.  Tobacco Use Disorder:  did not try to stop smoking. Stressful at home--gets blamed for everything.  3.  Emphysema:  CT of lung supported as well.  No changes concerning for lung cancer.    4.  Decreased pedal pulses:  ABIs okay in spring.    5.  Recurrent right hydrocele:  has not made the appt with [facility], though they have called to set up.  States he wants to do the GI appt the same day as Urology.  Discussed just getting this done.  6.  Hypertension:  took medicine late today.  Also stressed about car window breaking on way over here and knows his mother will blame him.    7.  Hyperlipidemia:  not taking Rosuvastatin.    8.  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  Current Meds  Medication Sig   metoprolol succinate (TOPROL-XL) 25 MG 24 hr tablet 1 tab by mouth daily with 50 mg Metoprolol   metoprolol succinate (TOPROL-XL) 50 MG 24 hr tablet TAKE 1 TABLET BY MOUTH ONCE DAILY WITH  OR  IMMEDIATELY FOLLOWING A MEAL   rosuvastatin (CRESTOR) 10 MG tablet TAKE 1 TABLET BY MOUTH ONCE DAILY WITH MEALS   triamcinolone cream (KENALOG) 0.1 % Apply 1 Application topically 2 (two) times daily.   Allergies  Allergen Reactions   Lisinopril     Complaint of tongue swelling never observed by medical personnel.  Switching to a different bp med with angioedema as possibility     Review of Systems    Objective:   BP (!) 160/70 (BP Location: Right Arm, Patient Position: Sitting,  Cuff Size: Normal)   Pulse 60   Resp 12   Ht 5\' 7"  (1.702 m)   Wt 175 lb (79.4 kg)   BMI 27.41 kg/m   Physical Exam Smells strongly of cigarette smoke Lungs:  CTA CV:  RRR without murmur or rub.  Radial pulses normal and equal  Assessment & Plan  Hx of anemia and + FIT:  No referrals until he gets back in touch.  Cannot get him to agree to appt.  2.  Urology referral for large hydrocele--again, hold on re referral until he is ready  3.  Tobacco use disorder:  no interest in quitting.  4.  Hypertension:  not at goal.  Late with meds today.  5.  Hyperlipidemia:  not taking Rosuvastatin.  Has not picked up.  6.  Stress/depression:  will see if he will work on case management to start with for his mother's care with [facility], LCSWA

## 2023-09-16 ENCOUNTER — Ambulatory Visit: Payer: Medicare Other | Admitting: Internal Medicine

## 2023-09-16 DIAGNOSIS — Z23 Encounter for immunization: Secondary | ICD-10-CM

## 2023-10-13 ENCOUNTER — Ambulatory Visit: Payer: Medicare Other | Admitting: Internal Medicine

## 2023-10-13 ENCOUNTER — Encounter: Payer: Self-pay | Admitting: Internal Medicine

## 2023-10-13 VITALS — BP 120/62 | HR 60 | Resp 16 | Ht 67.0 in | Wt 168.0 lb

## 2023-10-13 DIAGNOSIS — F439 Reaction to severe stress, unspecified: Secondary | ICD-10-CM

## 2023-10-13 DIAGNOSIS — D649 Anemia, unspecified: Secondary | ICD-10-CM

## 2023-10-13 DIAGNOSIS — Z23 Encounter for immunization: Secondary | ICD-10-CM

## 2023-10-13 DIAGNOSIS — R195 Other fecal abnormalities: Secondary | ICD-10-CM | POA: Diagnosis not present

## 2023-10-13 DIAGNOSIS — I10 Essential (primary) hypertension: Secondary | ICD-10-CM

## 2023-10-13 DIAGNOSIS — E119 Type 2 diabetes mellitus without complications: Secondary | ICD-10-CM

## 2023-10-13 DIAGNOSIS — N433 Hydrocele, unspecified: Secondary | ICD-10-CM

## 2023-10-13 DIAGNOSIS — Z125 Encounter for screening for malignant neoplasm of prostate: Secondary | ICD-10-CM

## 2023-10-13 DIAGNOSIS — Z716 Tobacco abuse counseling: Secondary | ICD-10-CM

## 2023-10-13 DIAGNOSIS — E782 Mixed hyperlipidemia: Secondary | ICD-10-CM | POA: Diagnosis not present

## 2023-10-13 DIAGNOSIS — F17200 Nicotine dependence, unspecified, uncomplicated: Secondary | ICD-10-CM

## 2023-10-13 DIAGNOSIS — Z79899 Other long term (current) drug therapy: Secondary | ICD-10-CM

## 2023-10-13 NOTE — Progress Notes (Signed)
° ° °  Subjective:    Patient ID: Jeffrey Stafford, male   DOB: 1951/08/15, 72 y.o.   MRN: [id number]   HPI   Hypertension:  BP well controlled on 75 mg Metoprolol XL.    2.  Tobacco Use Disorder/COPD:  no change.  No interest really in stopping--he is unwilling to really talk about goals and treatment today.  3.  Depression:  States difficulties "smoothed out" and did not pursue counseling.  He feels overwhelmed with need to transport mom and sister to all their appts and cleaning home.  4.  DM:  A1C at 6.5% in 2022 and has been in prediabetic range since with lifestyle changes. Has yearly eyecheck in June with Dr. Dione Booze.  5.  Anemia with + FIT dating back to 2023.  Has been unwilling to set up colonscopy  Anemia has resolved.  Still working on getting someone to take him for the procedure.    6.  HM:  has not had new COVID booster.  No family history of prostate cancer.    Current Meds  Medication Sig   metoprolol succinate (TOPROL-XL) 25 MG 24 hr tablet 1 tab by mouth daily with 50 mg Metoprolol   metoprolol succinate (TOPROL-XL) 50 MG 24 hr tablet TAKE 1 TABLET BY MOUTH ONCE DAILY WITH  OR  IMMEDIATELY FOLLOWING A MEAL   rosuvastatin (CRESTOR) 10 MG tablet 1 tab by mouth daily with meal   triamcinolone cream (KENALOG) 0.1 % Apply 1 Application topically 2 (two) times daily.   Allergies  Allergen Reactions   Lisinopril     Complaint of tongue swelling never observed by medical personnel.  Switching to a different bp med with angioedema as possibility     Review of Systems    Objective:   BP 120/62 (BP Location: Right Arm, Patient Position: Sitting, Cuff Size: Normal)   Pulse 60   Resp 16   Ht 5\' 7"  (1.702 m)   Wt 168 lb (76.2 kg)   BMI 26.31 kg/m   Physical Exam NAD Lungs:  decreased BS and rare end exp wheeze.  No crackles CV:  RRR with normal S1 and S2, No S3. S4 or murmur.  No carotid bruits.  Carotid, radial and DP pulses normal and equal.  Abd:  S, NT, No HSM  or mass, + BS LE:  No edema.   Assessment & Plan   Reported Stress with care of mother and sister:  Referral to SW for case management to see if can get transportation and other needs set up for his family members so he can get his own care.  Not clear if he is actually not wanting to pursue his healthcare or if truly barriers for his care.  Not getting a clear picture of needs, but perhaps case management can sort out.  2. Hypertension:  controlled  3.  History of anemia and +FIT:  have made multiple attempts to get him into GI for diagnostic colonoscopy/possible EGD.  He will let me know when he is ready.  4.  Tobacco use disorder: No interest in quitting, despite COPD/reactive airway disease.  Repeat Lung CT screen in May 2025.    5.  DM:  stable and at goal.  A1C, urine microalbumin/crea  6.  Hyperlipidemia:  Not at goal las check:  FLP  7.  HM:  Spikevax COVID and PSA  8.  Brings up recurrence of right hydrocele.  No interest in exam or referral back to surgery.

## 2023-10-14 LAB — CBC WITH DIFFERENTIAL/PLATELET
Basophils Absolute: 0.1 10*3/uL (ref 0.0–0.2)
Basos: 1 %
EOS (ABSOLUTE): 0.5 10*3/uL — ABNORMAL HIGH (ref 0.0–0.4)
Eos: 8 %
Hematocrit: 38.8 % (ref 37.5–51.0)
Hemoglobin: 12.3 g/dL — ABNORMAL LOW (ref 13.0–17.7)
Immature Grans (Abs): 0 10*3/uL (ref 0.0–0.1)
Immature Granulocytes: 0 %
Lymphocytes Absolute: 2.2 10*3/uL (ref 0.7–3.1)
Lymphs: 35 %
MCH: 28.8 pg (ref 26.6–33.0)
MCHC: 31.7 g/dL (ref 31.5–35.7)
MCV: 91 fL (ref 79–97)
Monocytes Absolute: 0.7 10*3/uL (ref 0.1–0.9)
Monocytes: 11 %
Neutrophils Absolute: 2.8 10*3/uL (ref 1.4–7.0)
Neutrophils: 45 %
Platelets: 226 10*3/uL (ref 150–450)
RBC: 4.27 x10E6/uL (ref 4.14–5.80)
RDW: 13 % (ref 11.6–15.4)
WBC: 6.3 10*3/uL (ref 3.4–10.8)

## 2023-10-14 LAB — COMPREHENSIVE METABOLIC PANEL
ALT: 13 [IU]/L (ref 0–44)
AST: 24 [IU]/L (ref 0–40)
Albumin: 4.2 g/dL (ref 3.8–4.8)
Alkaline Phosphatase: 117 [IU]/L (ref 44–121)
BUN/Creatinine Ratio: 18 (ref 10–24)
BUN: 26 mg/dL (ref 8–27)
Bilirubin Total: 0.6 mg/dL (ref 0.0–1.2)
CO2: 24 mmol/L (ref 20–29)
Calcium: 9.1 mg/dL (ref 8.6–10.2)
Chloride: 100 mmol/L (ref 96–106)
Creatinine, Ser: 1.44 mg/dL — ABNORMAL HIGH (ref 0.76–1.27)
Globulin, Total: 2.8 g/dL (ref 1.5–4.5)
Glucose: 94 mg/dL (ref 70–99)
Potassium: 4.6 mmol/L (ref 3.5–5.2)
Sodium: 137 mmol/L (ref 134–144)
Total Protein: 7 g/dL (ref 6.0–8.5)
eGFR: 52 mL/min/{1.73_m2} — ABNORMAL LOW (ref >59)

## 2023-10-14 LAB — LIPID PANEL W/O CHOL/HDL RATIO
Cholesterol, Total: 125 mg/dL (ref 100–199)
HDL: 47 mg/dL (ref >39)
LDL Chol Calc (NIH): 58 mg/dL (ref 0–99)
Triglycerides: 107 mg/dL (ref 0–149)
VLDL Cholesterol Cal: 20 mg/dL (ref 5–40)

## 2023-10-14 LAB — PSA: Prostate Specific Ag, Serum: 0.2 ng/mL (ref 0.0–4.0)

## 2023-10-14 LAB — MICROALBUMIN / CREATININE URINE RATIO
Creatinine, Urine: 96.6 mg/dL
Microalb/Creat Ratio: 12 mg/g{creat} (ref 0–29)
Microalbumin, Urine: 11.3 ug/mL

## 2023-10-14 LAB — HGB A1C W/O EAG: Hgb A1c MFr Bld: 6.3 % — ABNORMAL HIGH (ref 4.8–5.6)

## 2023-11-07 ENCOUNTER — Other Ambulatory Visit: Payer: Medicare Other

## 2023-12-25 ENCOUNTER — Other Ambulatory Visit: Payer: Self-pay | Admitting: Internal Medicine

## 2024-01-30 ENCOUNTER — Telehealth: Payer: Self-pay

## 2024-01-30 NOTE — Telephone Encounter (Signed)
Patient called of the weekend to report that he was feeling ill. Dr Delrae Alfred gave patient recommendations.   Patient called to report that his stomach complaints have gotten better. He reports that dizziness and lack of balance has gotten worse. Reports that intensity of his imbalance has gotten worse and struggles to stand or sit.

## 2024-01-30 NOTE — Telephone Encounter (Signed)
Patient instructed to visit ED to see if there is anything else causing his symptoms.

## 2024-02-08 NOTE — Telephone Encounter (Signed)
Please check with Jeffrey Stafford and make sure he is doing okay

## 2024-02-08 NOTE — Telephone Encounter (Signed)
Patient reports that his symptoms resolved and is doing better.

## 2024-02-09 ENCOUNTER — Other Ambulatory Visit: Payer: Self-pay | Admitting: Urology

## 2024-02-10 NOTE — Progress Notes (Addendum)
COVID Vaccine Completed:  Yes  Date of COVID positive in last 90 days:  No  PCP - Julieanne Manson, MD Cardiologist - N/A  Chest x-ray -  CT chest 05-16-23 Epic EKG - 02-14-24 Epic Stress Test - Yes ,several years ago ECHO -  N/A Cardiac Cath -  N/A Pacemaker/ICD device last checked: Spinal Cord Stimulator: N/A  Bowel Prep -  N/A  Sleep Study - N/A CPAP -   Prediabetic Fasting Blood Sugar -  Checks Blood Sugar  - does not check   Last dose of GLP1 agonist-  N/A GLP1 instructions:  Hold 7 days before surgery    Last dose of SGLT-2 inhibitors-  N/A SGLT-2 instructions:  Hold 3 days before surgery   Blood Thinner Instructions:  Last dose:   Time: Aspirin Instructions: N/A Last Dose:  Activity level:  Can go up a flight of stairs and perform activities of daily living without stopping and without symptoms of chest pain or shortness of breath.  Anesthesia review:  N/A  Patient denies shortness of breath, fever, cough and chest pain at PAT appointment  Patient verbalized understanding of instructions that were given to them at the PAT appointment. Patient was also instructed that they will need to review over the PAT instructions again at home before surgery.

## 2024-02-10 NOTE — Patient Instructions (Addendum)
SURGICAL WAITING ROOM VISITATION Patients having surgery or a procedure may have no more than 2 support people in the waiting area - these visitors may rotate.    Children under the age of 60 must have an adult with them who is not the patient.  Due to an increase in RSV and influenza rates and associated hospitalizations, children ages 78 and under may not visit patients in [facility] hospitals.   If the patient needs to stay at the hospital during part of their recovery, the visitor guidelines for inpatient rooms apply. Pre-op nurse will coordinate an appropriate time for 1 support person to accompany patient in pre-op.  This support person may not rotate.    Please refer to the [facility] website for the visitor guidelines for Inpatients (after your surgery is over and you are in a regular room).       Your procedure is scheduled on: 02-17-24   Report to [facility] Main Entrance    Report to admitting at 9:15 AM   Call this number if you have problems the morning of surgery [phone]   Do not eat food or drink liquids :After Midnight.          If you have questions, please contact your surgeon's office.   FOLLOW  ANY ADDITIONAL PRE OP INSTRUCTIONS YOU RECEIVED FROM YOUR SURGEON'S OFFICE!!!     Oral Hygiene is also important to reduce your risk of infection.                                    Remember - BRUSH YOUR TEETH THE MORNING OF SURGERY WITH YOUR REGULAR TOOTHPASTE   Do NOT smoke after Midnight   Take these medicines the morning of surgery with A SIP OF WATER:    Metoprolol   Rosuvastatin  Stop all vitamins and herbal supplements 7 days before surgery                              You may not have any metal on your body including  jewelry, and body piercing             Do not wear  lotions, powders, cologne, or deodorant              Men may shave face and neck.   Do not bring valuables to the hospital. [facility] IS NOT RESPONSIBLE   FOR  VALUABLES.   Contacts, dentures or bridgework may not be worn into surgery.  DO NOT BRING YOUR HOME MEDICATIONS TO THE HOSPITAL. PHARMACY WILL DISPENSE MEDICATIONS LISTED ON YOUR MEDICATION LIST TO YOU DURING YOUR ADMISSION IN THE HOSPITAL!    Patients discharged on the day of surgery will not be allowed to drive home.  Someone NEEDS to stay with you for the first 24 hours after anesthesia.              Please read over the following fact sheets you were given: IF YOU HAVE QUESTIONS ABOUT YOUR PRE-OP INSTRUCTIONS PLEASE CALL [phone] [place]  If you received a COVID test during your pre-op visit  it is requested that you wear a mask when out in public, stay away from anyone that may not be feeling well and notify your surgeon if you develop symptoms. If you test positive for Covid or have been in contact with anyone that  has tested positive in the last 10 days please notify you surgeon.  [facility] [place] - Preparing for Surgery Before surgery, you can play an important role.  Because skin is not sterile, your skin needs to be as free of germs as possible.  You can reduce the number of germs on your skin by washing with CHG (chlorahexidine gluconate) soap before surgery.  CHG is an antiseptic cleaner which kills germs and bonds with the skin to continue killing germs even after washing. Please DO NOT use if you have an allergy to CHG or antibacterial soaps.  If your skin becomes reddened/irritated stop using the CHG and inform your nurse when you arrive at Short Stay. Do not shave (including legs and underarms) for at least 48 hours prior to the first CHG shower.  You may shave your face/neck.  Please follow these instructions carefully:  1.  Shower with CHG Soap the night before surgery and the  morning of surgery.  2.  If you choose to wash your hair, wash your hair first as usual with your normal  shampoo.  3.  After you shampoo, rinse your hair and body thoroughly to remove the shampoo.                              4.  Use CHG as you would any other liquid soap.  You can apply chg directly to the skin and wash.  Gently with a scrungie or clean washcloth.  5.  Apply the CHG Soap to your body ONLY FROM THE NECK DOWN.   Do   not use on face/ open                           Wound or open sores. Avoid contact with eyes, ears mouth and   genitals (private parts).                       Wash face,  Genitals (private parts) with your normal soap.             6.  Wash thoroughly, paying special attention to the area where your    surgery  will be performed.  7.  Thoroughly rinse your body with warm water from the neck down.  8.  DO NOT shower/wash with your normal soap after using and rinsing off the CHG Soap.                9.  Pat yourself dry with a clean towel.            10.  Wear clean pajamas.            11.  Place clean sheets on your bed the night of your first shower and do not  sleep with pets. Day of Surgery : Do not apply any lotions/deodorants the morning of surgery.  Please wear clean clothes to the hospital/surgery center.  FAILURE TO FOLLOW THESE INSTRUCTIONS MAY RESULT IN THE CANCELLATION OF YOUR SURGERY  PATIENT SIGNATURE_________________________________  NURSE SIGNATURE__________________________________  ________________________________________________________________________

## 2024-02-14 ENCOUNTER — Other Ambulatory Visit: Payer: Self-pay

## 2024-02-14 ENCOUNTER — Encounter (HOSPITAL_COMMUNITY)
Admission: RE | Admit: 2024-02-14 | Discharge: 2024-02-14 | Disposition: A | Discharge status: Home / Self Care | Payer: Medicare Other | Source: Ambulatory Visit | Attending: Urology | Admitting: Urology

## 2024-02-14 ENCOUNTER — Encounter (HOSPITAL_COMMUNITY): Payer: Self-pay

## 2024-02-14 VITALS — BP 137/77 | HR 51 | Temp 97.9°F | Resp 12 | Ht 68.0 in | Wt 160.6 lb

## 2024-02-14 DIAGNOSIS — D649 Anemia, unspecified: Secondary | ICD-10-CM | POA: Diagnosis not present

## 2024-02-14 DIAGNOSIS — Z01818 Encounter for other preprocedural examination: Secondary | ICD-10-CM | POA: Diagnosis present

## 2024-02-14 DIAGNOSIS — R001 Bradycardia, unspecified: Secondary | ICD-10-CM | POA: Diagnosis not present

## 2024-02-14 DIAGNOSIS — I10 Essential (primary) hypertension: Secondary | ICD-10-CM | POA: Insufficient documentation

## 2024-02-14 DIAGNOSIS — E119 Type 2 diabetes mellitus without complications: Secondary | ICD-10-CM | POA: Diagnosis not present

## 2024-02-14 HISTORY — DX: Prediabetes: R73.03

## 2024-02-14 LAB — CBC
HCT: 37 % — ABNORMAL LOW (ref 39.0–52.0)
Hemoglobin: 12.2 g/dL — ABNORMAL LOW (ref 13.0–17.0)
MCH: 29.1 pg (ref 26.0–34.0)
MCHC: 33 g/dL (ref 30.0–36.0)
MCV: 88.3 fL (ref 80.0–100.0)
Platelets: 237 10*3/uL (ref 150–400)
RBC: 4.19 MIL/uL — ABNORMAL LOW (ref 4.22–5.81)
RDW: 12.5 % (ref 11.5–15.5)
WBC: 8 10*3/uL (ref 4.0–10.5)
nRBC: 0 % (ref 0.0–0.2)

## 2024-02-14 LAB — BASIC METABOLIC PANEL
Anion gap: 9 (ref 5–15)
BUN: 8 mg/dL (ref 8–23)
CO2: 23 mmol/L (ref 22–32)
Calcium: 8.6 mg/dL — ABNORMAL LOW (ref 8.9–10.3)
Chloride: 104 mmol/L (ref 98–111)
Creatinine, Ser: 1 mg/dL (ref 0.61–1.24)
GFR, Estimated: >60 mL/min (ref >60)
Glucose, Bld: 132 mg/dL — ABNORMAL HIGH (ref 70–99)
Potassium: 4 mmol/L (ref 3.5–5.1)
Sodium: 136 mmol/L (ref 135–145)

## 2024-02-17 ENCOUNTER — Encounter (HOSPITAL_COMMUNITY): Admission: RE | Payer: Self-pay | Source: Ambulatory Visit

## 2024-02-17 ENCOUNTER — Ambulatory Visit (HOSPITAL_COMMUNITY): Admission: RE | Admit: 2024-02-17 | Payer: Medicare Other | Source: Ambulatory Visit | Admitting: Urology

## 2024-02-17 SURGERY — HYDROCELECTOMY ADULT
Anesthesia: General | Laterality: Right

## 2024-02-17 NOTE — Discharge Instructions (Signed)
1 - All stitches are dissolvable and will disappear over about 2-3 weeks. Some thin bloody drainage is expected from drain site. NO heavy lifting or straddle activity x 48 hours.   2 - Call MD or go to ER for fever >102, severe pain / nausea / vomiting not relieved by medications, or acute change in medical status

## 2024-02-17 NOTE — H&P (Incomplete)
Jeffrey Stafford is an 73 y.o. male.    Chief Complaint: Pre-OP RIGHT Hydrocelectomy  HPI:   1 - Right Hydrocele - s/p uncomplicated Rt hydrocelectomy 2016. Path bengin. Was huge (12cm). Repeat exam 01/2018 with some recurrence, approx 6cm likely hydrocele. Some bother at night with trying to sleep. Scrotal US 02/2018 confirms likely spermatoceles v. parital hydrocele recurrence, no solid masses.    PMH sig for HTN, HLD. NO CV diseease / blood thinners. About 30PY smoker, still smokes some. 92yo mom lives with him he is her primary caretaker. His PCP is Julieanne Manson with [facility].    Today " Jeffrey Stafford " is seen to proceed with RIGHT hydrocelectomy. NO interval fevers. Hgb and Cr acceptable.     Past Medical History:  Diagnosis Date   Allergy    seasonal allergies   Anemia    hx of   Anxiety    hx of   Arthritis    generalized   Cocaine substance abuse (HCC) 2014-2015   Depression    hx of   Eczema    GERD (gastroesophageal reflux disease)    with certain foods   Hyperlipidemia    not taking meds at this time   Hypertension    not taking meds at this time   Nocturia    Pre-diabetes    Right hydrocele     Past Surgical History:  Procedure Laterality Date   HYDROCELE EXCISION Right 12/05/2015   Procedure: RIGHT HYDROCELECTOMY ADULT;  Surgeon: Sebastian Ache, MD;  Location: [facility];  Service: Urology;  Laterality: Right;   ORCHIOPEXY Right 12/05/2015   Procedure: ORCHIOPEXY/ SPERMATOCELECTOMY  ADULT;  Surgeon: Sebastian Ache, MD;  Location: [facility];  Service: Urology;  Laterality: Right;   TONSILLECTOMY     WISDOM TOOTH EXTRACTION      Family History  Problem Relation Age of Onset   Stroke Mother    Diabetes Mother    Heart disease Mother        Valvular Cardiomyopathy   Pulmonary embolism Father        complication of amputation of leg for gangrene   Peripheral vascular disease Father    Alcohol abuse  Father    Cancer Sister 85       colon:  2023 in remission   Bipolar disorder Sister    Cirrhosis Brother        alcoholic--had shunt   Lung cancer Brother        had removal of spot from lung   Alcohol abuse Brother        after death of son   Drug abuse Brother        Following death of son   Kidney disease Brother        not clear of cause:  exposure to something while in service in [place].  Peritoneal dialysis.  developed skin infection around catheter and died   Social History:  reports that he has been smoking cigarettes. He has a 17.5 pack-year smoking history. He has never used smokeless tobacco. He reports current alcohol use. He reports current drug use. Drug: Cocaine.  Allergies:  Allergies  Allergen Reactions   Lisinopril Swelling    Complaint of tongue swelling never observed by medical personnel.  Switching to a different bp med with angioedema as possibility    No medications prior to admission.    No results found for this or any previous visit (from the past 48  hours). No results found.  Review of Systems  Constitutional:  Negative for chills and fever.  Genitourinary:  Positive for scrotal swelling.  All other systems reviewed and are negative.   There were no vitals taken for this visit. Physical Exam HENT:     Head: Normocephalic.  Eyes:     Pupils: Pupils are equal, round, and reactive to light.  Cardiovascular:     Rate and Rhythm: Normal rate.  Pulmonary:     Effort: Pulmonary effort is normal.  Abdominal:     General: Abdomen is flat.  Genitourinary:    Comments: Stable Rt scrotal swelling that is soft. No palpable hernias.  Musculoskeletal:        General: Normal range of motion.     Cervical back: Normal range of motion.  Skin:    General: Skin is warm.  Neurological:     General: No focal deficit present.     Mental Status: He is alert.  Psychiatric:        Mood and Affect: Mood normal.      Assessment/Plan  Proceed as  planned with RIGHT hydrocelectomy. Risks, benefits, alternatives, expected peri-op course discussed previously and reiterated today.   Loletta Parish., MD 02/17/2024, 7:00 AM

## 2024-02-17 NOTE — Progress Notes (Signed)
Contacted patient to see if he was planning to come for surgery today.  Patient stated that the surgeons office called him and told him that is procedure was moved to Monday.  Patient was told that his procedure was still on the scheduled for today and was not on the schedule for Monday.  Patient stated he was not coming for surgery today  OR desk notifed.

## 2024-03-31 ENCOUNTER — Emergency Department (HOSPITAL_COMMUNITY)

## 2024-03-31 ENCOUNTER — Encounter (HOSPITAL_COMMUNITY): Payer: Self-pay | Admitting: Emergency Medicine

## 2024-03-31 ENCOUNTER — Other Ambulatory Visit: Payer: Self-pay

## 2024-03-31 ENCOUNTER — Emergency Department (HOSPITAL_COMMUNITY)
Admission: EM | Admit: 2024-03-31 | Discharge: 2024-03-31 | Disposition: A | Discharge status: Home / Self Care | Attending: Emergency Medicine | Admitting: Emergency Medicine

## 2024-03-31 DIAGNOSIS — I10 Essential (primary) hypertension: Secondary | ICD-10-CM | POA: Insufficient documentation

## 2024-03-31 DIAGNOSIS — T401X1A Poisoning by heroin, accidental (unintentional), initial encounter: Secondary | ICD-10-CM | POA: Insufficient documentation

## 2024-03-31 DIAGNOSIS — X58XXXA Exposure to other specified factors, initial encounter: Secondary | ICD-10-CM | POA: Insufficient documentation

## 2024-03-31 DIAGNOSIS — Z79899 Other long term (current) drug therapy: Secondary | ICD-10-CM | POA: Insufficient documentation

## 2024-03-31 DIAGNOSIS — T50901A Poisoning by unspecified drugs, medicaments and biological substances, accidental (unintentional), initial encounter: Secondary | ICD-10-CM

## 2024-03-31 DIAGNOSIS — R4182 Altered mental status, unspecified: Secondary | ICD-10-CM | POA: Diagnosis not present

## 2024-03-31 DIAGNOSIS — R310 Gross hematuria: Secondary | ICD-10-CM

## 2024-03-31 DIAGNOSIS — E119 Type 2 diabetes mellitus without complications: Secondary | ICD-10-CM | POA: Diagnosis not present

## 2024-03-31 LAB — URINALYSIS, W/ REFLEX TO CULTURE (INFECTION SUSPECTED)
Glucose, UA: NEGATIVE mg/dL
RBC / HPF: >50 RBC/hpf (ref 0–5)

## 2024-03-31 LAB — CBC WITH DIFFERENTIAL/PLATELET
Abs Immature Granulocytes: 0.05 10*3/uL (ref 0.00–0.07)
Basophils Absolute: 0 10*3/uL (ref 0.0–0.1)
Basophils Relative: 0 %
Eosinophils Absolute: 0 10*3/uL (ref 0.0–0.5)
Eosinophils Relative: 0 %
HCT: 41.5 % (ref 39.0–52.0)
Hemoglobin: 13.8 g/dL (ref 13.0–17.0)
Immature Granulocytes: 1 %
Lymphocytes Relative: 21 %
Lymphs Abs: 2.3 10*3/uL (ref 0.7–4.0)
MCH: 29.5 pg (ref 26.0–34.0)
MCHC: 33.3 g/dL (ref 30.0–36.0)
MCV: 88.7 fL (ref 80.0–100.0)
Monocytes Absolute: 1.1 10*3/uL — ABNORMAL HIGH (ref 0.1–1.0)
Monocytes Relative: 10 %
Neutro Abs: 7.2 10*3/uL (ref 1.7–7.7)
Neutrophils Relative %: 68 %
Platelets: 288 10*3/uL (ref 150–400)
RBC: 4.68 MIL/uL (ref 4.22–5.81)
RDW: 13.2 % (ref 11.5–15.5)
WBC: 10.6 10*3/uL — ABNORMAL HIGH (ref 4.0–10.5)
nRBC: 0 % (ref 0.0–0.2)

## 2024-03-31 LAB — CK: Total CK: 338 U/L (ref 49–397)

## 2024-03-31 LAB — CBG MONITORING, ED: Glucose-Capillary: 155 mg/dL — ABNORMAL HIGH (ref 70–99)

## 2024-03-31 LAB — I-STAT CHEM 8, ED
BUN: 12 mg/dL (ref 8–23)
Calcium, Ion: 1.14 mmol/L — ABNORMAL LOW (ref 1.15–1.40)
Chloride: 104 mmol/L (ref 98–111)
Creatinine, Ser: 1.5 mg/dL — ABNORMAL HIGH (ref 0.61–1.24)
Glucose, Bld: 140 mg/dL — ABNORMAL HIGH (ref 70–99)
HCT: 43 % (ref 39.0–52.0)
Hemoglobin: 14.6 g/dL (ref 13.0–17.0)
Potassium: 4.1 mmol/L (ref 3.5–5.1)
Sodium: 141 mmol/L (ref 135–145)
TCO2: 26 mmol/L (ref 22–32)

## 2024-03-31 LAB — COMPREHENSIVE METABOLIC PANEL WITH GFR
ALT: 18 U/L (ref 0–44)
AST: 32 U/L (ref 15–41)
Albumin: 3.9 g/dL (ref 3.5–5.0)
Alkaline Phosphatase: 82 U/L (ref 38–126)
Anion gap: 10 (ref 5–15)
BUN: 11 mg/dL (ref 8–23)
CO2: 25 mmol/L (ref 22–32)
Calcium: 9.4 mg/dL (ref 8.9–10.3)
Chloride: 105 mmol/L (ref 98–111)
Creatinine, Ser: 1.46 mg/dL — ABNORMAL HIGH (ref 0.61–1.24)
GFR, Estimated: 51 mL/min — ABNORMAL LOW (ref >60)
Glucose, Bld: 142 mg/dL — ABNORMAL HIGH (ref 70–99)
Potassium: 4 mmol/L (ref 3.5–5.1)
Sodium: 140 mmol/L (ref 135–145)
Total Bilirubin: 1.2 mg/dL (ref 0.0–1.2)
Total Protein: 8 g/dL (ref 6.5–8.1)

## 2024-03-31 LAB — ETHANOL: Alcohol, Ethyl (B): <10 mg/dL (ref <10)

## 2024-03-31 MED ORDER — TAMSULOSIN HCL 0.4 MG PO CAPS: 0.4000 mg | ORAL_CAPSULE | Freq: Every day | ORAL | 0 refills | Status: AC

## 2024-03-31 MED ORDER — LACTATED RINGERS IV BOLUS
1000.0000 mL | Freq: Once | INTRAVENOUS | Status: AC
  Administered 2024-03-31: 1000 mL via INTRAVENOUS

## 2024-03-31 MED ORDER — CEPHALEXIN 500 MG PO CAPS: 500.0000 mg | ORAL_CAPSULE | Freq: Two times a day (BID) | ORAL | 0 refills | Status: AC

## 2024-03-31 MED ORDER — NALOXONE HCL 0.4 MG/ML IJ SOLN: INTRAMUSCULAR | 3 refills | Status: DC

## 2024-03-31 NOTE — ED Provider Notes (Signed)
  Physical Exam  BP 136/77   Pulse (!) 53   Temp (!) 97.3 F (36.3 C) (Oral)   Resp 19   Ht 5\' 8"  (1.727 m)   Wt 73 kg   SpO2 100%   BMI 24.47 kg/m   Physical Exam  Procedures  Procedures  ED Course / MDM   Clinical Course as of 03/31/24 1553  Sat Mar 31, 2024  1538 Assumed care from Dr. Fredderick Phenix.  73 yo M who presented with overdose on heroin per family. Also has hx of cocaine abuse. Found in car and pulled out into car. Got narcan x4 and RR has improved; however, still has poor mental status and believed to be intoxicated still. Follows commands and mumbles words now. Ct head and c-spine negative. Bradycardic but otherwise stable. Satting well on RA. Patient needs to metabolize and needs to be reassessed.  [RP]    Clinical Course User Index [RP] Rondel Baton, MD   Medical Decision Making Amount and/or Complexity of Data Reviewed Labs: ordered. Radiology: ordered.

## 2024-03-31 NOTE — ED Notes (Signed)
Bladder scan 34 ml.

## 2024-03-31 NOTE — ED Provider Notes (Addendum)
 [place] EMERGENCY DEPARTMENT AT [facility] Provider Note   CSN: [id number] Arrival date & time: 03/31/24  1319     History  No chief complaint on file.   Jeffrey Stafford is a 73 y.o. male.  Patient is a 73 year old male who presents with a possible overdose.  He has a history of hypertension, hyperlipidemia, diabetes, GERD, cocaine abuse.  Per EMS, he was found in his car by family.  He was pulled out into the front yard by [facility].  He was given 4 doses of Narcan by EMS/fire.  Per them, he had had some improved respirations but his mental status did not improve much.  His blood sugar was normal.  No other history is obtained.       Home Medications Prior to Admission medications   Medication Sig Start Date End Date Taking? Authorizing Provider  metoprolol succinate (TOPROL-XL) 25 MG 24 hr tablet 1 tab by mouth daily with 50 mg Metoprolol 04/27/23   Julieanne Manson, MD  metoprolol succinate (TOPROL-XL) 50 MG 24 hr tablet 1 tab by mouth with 25 mg Metoprolol Succinate once daily with meal 12/25/23   Julieanne Manson, MD  rosuvastatin (CRESTOR) 10 MG tablet 1 tab by mouth daily with meal 07/05/23   Julieanne Manson, MD  triamcinolone cream (KENALOG) 0.1 % Apply 1 Application topically 2 (two) times daily. Patient not taking: Reported on 02/09/2024 07/05/23   Julieanne Manson, MD      Allergies    Lisinopril    Review of Systems   Review of Systems  Unable to perform ROS: Mental status change    Physical Exam Updated Vital Signs BP 136/77   Pulse (!) 53   Temp (!) 97.3 F (36.3 C) (Oral)   Resp 19   Ht 5\' 8"  (1.727 m)   Wt 73 kg   SpO2 100%   BMI 24.47 kg/m  Physical Exam Constitutional:      Appearance: He is well-developed.     Comments: Will respond to verbal stimuli  HENT:     Head: Normocephalic and atraumatic.  Eyes:     Pupils: Pupils are equal, round, and reactive to light.     Comments: Small and symmetric bilaterally   Cardiovascular:     Rate and Rhythm: Normal rate and regular rhythm.     Heart sounds: Normal heart sounds.  Pulmonary:     Effort: Pulmonary effort is normal. No respiratory distress.     Breath sounds: Normal breath sounds. No wheezing or rales.  Chest:     Chest wall: No tenderness.  Abdominal:     General: Bowel sounds are normal.     Palpations: Abdomen is soft.     Tenderness: There is no abdominal tenderness. There is no guarding or rebound.  Musculoskeletal:        General: Normal range of motion.     Cervical back: Normal range of motion and neck supple.  Lymphadenopathy:     Cervical: No cervical adenopathy.  Skin:    General: Skin is warm and dry.     Findings: No rash.  Neurological:     Comments: Patient is moving all extremities symmetrically.  Will follow commands, no focal deficits.  He is following commands but not really answering questions.     ED Results / Procedures / Treatments   Labs (all labs ordered are listed, but only abnormal results are displayed) Labs Reviewed  COMPREHENSIVE METABOLIC PANEL WITH GFR - Abnormal; Notable  for the following components:      Result Value   Glucose, Bld 142 (*)    Creatinine, Ser 1.46 (*)    GFR, Estimated 51 (*)    All other components within normal limits  CBC WITH DIFFERENTIAL/PLATELET - Abnormal; Notable for the following components:   WBC 10.6 (*)    Monocytes Absolute 1.1 (*)    All other components within normal limits  CBG MONITORING, ED - Abnormal; Notable for the following components:   Glucose-Capillary 155 (*)    All other components within normal limits  I-STAT CHEM 8, ED - Abnormal; Notable for the following components:   Creatinine, Ser 1.50 (*)    Glucose, Bld 140 (*)    Calcium, Ion 1.14 (*)    All other components within normal limits  RAPID URINE DRUG SCREEN, HOSP PERFORMED  URINALYSIS, W/ REFLEX TO CULTURE (INFECTION SUSPECTED)    EKG None  Radiology CT Head Wo Contrast Result Date:  03/31/2024 CLINICAL DATA:  Mental status change, unknown cause; Neck trauma (Age >= 65y). Noncontrast. Altered mental status EXAM: CT HEAD WITHOUT CONTRAST CT CERVICAL SPINE WITHOUT CONTRAST TECHNIQUE: Multidetector CT imaging of the head and cervical spine was performed following the standard protocol without intravenous contrast. Multiplanar CT image reconstructions of the cervical spine were also generated. RADIATION DOSE REDUCTION: This exam was performed according to the departmental dose-optimization program which includes automated exposure control, adjustment of the mA and/or kV according to patient size and/or use of iterative reconstruction technique. COMPARISON:  None Available. FINDINGS: CT HEAD FINDINGS Brain: No evidence of large-territorial acute infarction. No parenchymal hemorrhage. No mass lesion. No extra-axial collection. No mass effect or midline shift. No hydrocephalus. Basilar cisterns are patent. Vascular: No hyperdense vessel. Skull: No acute fracture or focal lesion. Sinuses/Orbits: Bilateral maxillary sinus mucosal thickening. Otherwise paranasal sinuses and mastoid air cells are clear. The orbits are unremarkable. Other: None. CT CERVICAL SPINE FINDINGS Alignment: Normal. Skull base and vertebrae: Multilevel severe degenerative changes spine. Associated moderate severe osseous neural stenosis at the left C4-C5, left C5-C6, right C4-C5 levels. No acute fracture. No aggressive appearing focal osseous lesion or focal pathologic process. Soft tissues and spinal canal: No prevertebral fluid or swelling. No visible canal hematoma. Upper chest: Biapical paraseptal emphysematous changes. Other: None. IMPRESSION: 1. No acute intracranial abnormality. 2. No acute displaced fracture or traumatic listhesis of the cervical spine. 3.  Emphysema (ICD10-J43.9). Electronically Signed   By: Tish Frederickson M.D.   On: 03/31/2024 15:19   CT Cervical Spine Wo Contrast Result Date: 03/31/2024 CLINICAL DATA:   Mental status change, unknown cause; Neck trauma (Age >= 65y). Noncontrast. Altered mental status EXAM: CT HEAD WITHOUT CONTRAST CT CERVICAL SPINE WITHOUT CONTRAST TECHNIQUE: Multidetector CT imaging of the head and cervical spine was performed following the standard protocol without intravenous contrast. Multiplanar CT image reconstructions of the cervical spine were also generated. RADIATION DOSE REDUCTION: This exam was performed according to the departmental dose-optimization program which includes automated exposure control, adjustment of the mA and/or kV according to patient size and/or use of iterative reconstruction technique. COMPARISON:  None Available. FINDINGS: CT HEAD FINDINGS Brain: No evidence of large-territorial acute infarction. No parenchymal hemorrhage. No mass lesion. No extra-axial collection. No mass effect or midline shift. No hydrocephalus. Basilar cisterns are patent. Vascular: No hyperdense vessel. Skull: No acute fracture or focal lesion. Sinuses/Orbits: Bilateral maxillary sinus mucosal thickening. Otherwise paranasal sinuses and mastoid air cells are clear. The orbits are unremarkable. Other: None. CT CERVICAL  SPINE FINDINGS Alignment: Normal. Skull base and vertebrae: Multilevel severe degenerative changes spine. Associated moderate severe osseous neural stenosis at the left C4-C5, left C5-C6, right C4-C5 levels. No acute fracture. No aggressive appearing focal osseous lesion or focal pathologic process. Soft tissues and spinal canal: No prevertebral fluid or swelling. No visible canal hematoma. Upper chest: Biapical paraseptal emphysematous changes. Other: None. IMPRESSION: 1. No acute intracranial abnormality. 2. No acute displaced fracture or traumatic listhesis of the cervical spine. 3.  Emphysema (ICD10-J43.9). Electronically Signed   By: Tish Frederickson M.D.   On: 03/31/2024 15:19   DG Chest Port 1 View Result Date: 03/31/2024 CLINICAL DATA:  Status post overdose. Assess for  signs of aspiration. EXAM: PORTABLE CHEST 1 VIEW COMPARISON:  08/12/2020 FINDINGS: The heart size and mediastinal contours are within normal limits. Both lungs are clear. The visualized skeletal structures are unremarkable. IMPRESSION: No active disease. Electronically Signed   By: Signa Kell M.D.   On: 03/31/2024 14:52    Procedures Procedures    Medications Ordered in ED Medications - No data to display  ED Course/ Medical Decision Making/ A&P Clinical Course as of 03/31/24 1540  Sat Mar 31, 2024  1538 73 yo M who presented with overdose on heroin per family. Also has hx of cocaine abuse. Found in car and pulled out into car. Got narcan x4 and RR has improved. Has poor mental status. Follows commands and mumbles words now. Ct head and c-spine negative. Bradycardic but otherwise stable. Satting well on RA. Patient needs to metabolize and needs to be reassessed.  [RP]    Clinical Course User Index [RP] Rondel Baton, MD                                 Medical Decision Making Amount and/or Complexity of Data Reviewed Labs: ordered. Radiology: ordered.   Patient is a 73 year old who presents with altered mental status from a presumed overdose.  It was reported by EMS that he had overdosed on heroin.  Per chart review, he has a history of cocaine abuse.  He is drowsy but will answer open his eyes to verbal stimuli and follow commands.  I do not find any focal deficits or concerns for stroke.  Labs so far are nonconcerning.  Creatinine is mildly elevated.  Awaiting urinalysis and UDS.  Chest x-ray was interpreted by me and confirmed by the radiologist to show no evidence of pneumonia or aspiration.  Head CT does not show any acute abnormality.PT care turned over to Dr. Eloise Harman, pending reassessment after a period of observation.  Final Clinical Impression(s) / ED Diagnoses Final diagnoses:  None    Rx / DC Orders ED Discharge Orders     None         Rolan Bucco,  MD 03/31/24 1528    Rolan Bucco, MD 03/31/24 1540

## 2024-03-31 NOTE — ED Triage Notes (Signed)
 Pt BIB [facility] from front yard due to overdosing.  Pt was pulled out of car by family.  Pt reports heroin but EMS did not visualize.  [place] pupils with EMS.  Fire department gave 2 narcan.  [facility] gave one narcan then waited and gave another narcan.  Total given 4 of narcan.  20g left forearm.  NS.    VS BP 90/50, HR 50, CBG 120

## 2024-03-31 NOTE — Discharge Instructions (Addendum)
 You were seen for your overdose in the emergency department.   At home, please use Narcan if you feel you are overdosing or anyone around you overdoses.  Take the antibiotics (Keflex) and Flomax to make sure that you do not develop urinary tract infection or urinary retention.    Check your MyChart online for the results of any tests that had not resulted by the time you left the emergency department.   Follow-up with your primary doctor in 2-3 days regarding your visit.  Follow-up with urology about the blood in your urine.  Return immediately to the emergency department if you experience any of the following: Urinary retention, overdose, or any other concerning symptoms.    Thank you for visiting our Emergency Department. It was a pleasure taking care of you today.

## 2024-04-01 LAB — URINE CULTURE: Culture: <10000 — AB

## 2024-04-12 ENCOUNTER — Ambulatory Visit (INDEPENDENT_AMBULATORY_CARE_PROVIDER_SITE_OTHER): Payer: Medicare Other | Admitting: Internal Medicine

## 2024-04-12 ENCOUNTER — Encounter: Payer: Self-pay | Admitting: Internal Medicine

## 2024-04-12 VITALS — BP 138/80 | HR 68 | Resp 16 | Ht 67.0 in | Wt 171.0 lb

## 2024-04-12 DIAGNOSIS — N189 Chronic kidney disease, unspecified: Secondary | ICD-10-CM | POA: Insufficient documentation

## 2024-04-12 DIAGNOSIS — I10 Essential (primary) hypertension: Secondary | ICD-10-CM | POA: Diagnosis not present

## 2024-04-12 DIAGNOSIS — Z716 Tobacco abuse counseling: Secondary | ICD-10-CM

## 2024-04-12 DIAGNOSIS — R195 Other fecal abnormalities: Secondary | ICD-10-CM | POA: Diagnosis not present

## 2024-04-12 DIAGNOSIS — H269 Unspecified cataract: Secondary | ICD-10-CM

## 2024-04-12 DIAGNOSIS — Z0000 Encounter for general adult medical examination without abnormal findings: Secondary | ICD-10-CM

## 2024-04-12 DIAGNOSIS — F17200 Nicotine dependence, unspecified, uncomplicated: Secondary | ICD-10-CM

## 2024-04-12 DIAGNOSIS — E119 Type 2 diabetes mellitus without complications: Secondary | ICD-10-CM

## 2024-04-12 DIAGNOSIS — N433 Hydrocele, unspecified: Secondary | ICD-10-CM

## 2024-04-12 DIAGNOSIS — E782 Mixed hyperlipidemia: Secondary | ICD-10-CM | POA: Diagnosis not present

## 2024-04-12 DIAGNOSIS — F1990 Other psychoactive substance use, unspecified, uncomplicated: Secondary | ICD-10-CM

## 2024-04-12 DIAGNOSIS — Z79899 Other long term (current) drug therapy: Secondary | ICD-10-CM

## 2024-04-12 MED ORDER — FUROSEMIDE 20 MG PO TABS: 20.0000 mg | ORAL_TABLET | Freq: Every day | ORAL | 3 refills | Status: DC

## 2024-04-12 MED ORDER — EMPAGLIFLOZIN 10 MG PO TABS: 10.0000 mg | ORAL_TABLET | Freq: Every day | ORAL | 11 refills | Status: AC

## 2024-04-12 NOTE — Progress Notes (Addendum)
 Subjective:    Patient ID: Jeffrey Stafford, male   DOB: 06-01-1951, 73 y.o.   MRN: [id number]   HPI  Here for Male CPE:  1.  STE:  Does check--had hydrocele on right.  Very uncomfortable and for some reason hydrocelectomy canceled in February.  Patient cannot say why and has not heard back.    2.  PSA: Last 09/2023 and normal at 0.2 No family history of prostate cancer.    3.  Guaiac Cards/FIT:  Has not returned since + in 2023.  Cannot get him to set up colonoscopy.    4.  Colonoscopy: Still has not scheduled colonoscopy.  We have tried to set this up multiple times, but patient never ready to proceed.  History of + FIT in 2023.  Today states his reasoning is because of discomfort from the right hydrocele and wanting to get that addressed.  Previously, has stated due to needing to care for family members and not having someone to accompany him.    5.  Cholesterol/Glucose:  Last cholesterol panel at goal in October 2024.  Same for A1C at 6.3%  and history of diet controlled DM.  Lipid Panel     Component Value Date/Time   CHOL 125 10/13/2023 1055   TRIG 107 10/13/2023 1055   HDL 47 10/13/2023 1055   LDLCALC 58 10/13/2023 1055   LABVLDL 20 10/13/2023 1055      6.  Immunizations: Up to date. Immunization History  Administered Date(s) Administered   Fluad Quad(high Dose 65+) 10/07/2022   Fluad Trivalent(High Dose 65+) 09/16/2023   Influenza Inj Mdck Quad Pf 12/02/2017   Influenza, High Dose Seasonal PF 09/24/2018, 10/05/2019   Moderna Covid-19 Fall Seasonal Vaccine 28yrs & older 07/05/2023, 10/13/2023   Moderna SARS-COV2 Booster Vaccination 07/09/2021   Moderna Sars-Covid-2 Vaccination 02/25/2020, 03/24/2020, 12/29/2020   PNEUMOCOCCAL CONJUGATE-20 04/12/2023   Pneumococcal Conjugate-13 10/05/2019   Tdap 10/05/2019   Zoster Recombinant(Shingrix) 10/05/2019, 12/10/2019     7.  Other:  recent accidental OD with powder cocaine laced with ?heroin vs fentanyl  on 03/31/2024.  [facility]  EMS called after found in car by family following use of cocaine.  Was given Narcan  in field x4 and resumed respirations.  He was seen subsequently in ED for continued mental status changes with normal CT of head.  UDS apparently not completed while there.  Also with traumatic urinary catheterization and placed on Keflex  at discharge from ED when mental status cleared.  Had hematuria for 2 days, but has cleared.   He states he uses cocaine twice monthly when having an alcoholic beverage.  Uses it more so due to pain in groin with large hydrocele.    Current Meds  Medication Sig   metoprolol  succinate (TOPROL -XL) 25 MG 24 hr tablet 1 tab by mouth daily with 50 mg Metoprolol    metoprolol  succinate (TOPROL -XL) 50 MG 24 hr tablet 1 tab by mouth with 25 mg Metoprolol  Succinate once daily with meal   rosuvastatin  (CRESTOR ) 10 MG tablet 1 tab by mouth daily with meal   tamsulosin  (FLOMAX ) 0.4 MG CAPS capsule Take 1 capsule (0.4 mg total) by mouth daily after breakfast.   Allergies  Allergen Reactions   Lisinopril  Swelling    Complaint of tongue swelling never observed by medical personnel.  Switching to a different bp med with angioedema as possibility   Past Medical History:  Diagnosis Date   Allergy    seasonal allergies   Anemia    hx  of   Anxiety    hx of   Arthritis    generalized   Cocaine substance abuse (HCC) 2014-2015   Depression    hx of   Eczema    GERD (gastroesophageal reflux disease)    with certain foods   Hyperlipidemia    not taking meds at this time   Hypertension    not taking meds at this time   Nocturia    Pre-diabetes    Right hydrocele    Past Surgical History:  Procedure Laterality Date   HYDROCELE EXCISION Right 12/05/2015   Procedure: RIGHT HYDROCELECTOMY ADULT;  Surgeon: Ricardo Likens, MD;  Location: [facility];  Service: Urology;  Laterality: Right;   HYDROCELE EXCISION Right 05/25/2024   Procedure: HYDROCELECTOMY;  Surgeon: Likens Ricardo KATHEE Mickey., MD;  Location: [facility];  Service: Urology;  Laterality: Right;   ORCHIOPEXY Right 12/05/2015   Procedure: ORCHIOPEXY/ SPERMATOCELECTOMY  ADULT;  Surgeon: Ricardo Likens, MD;  Location: [facility];  Service: Urology;  Laterality: Right;   ORCHIOPEXY  05/25/2024   Procedure: ORCHIOPEXY ADULT;  Surgeon: Likens Ricardo KATHEE Mickey., MD;  Location: [facility];  Service: Urology;;   TONSILLECTOMY     WISDOM TOOTH EXTRACTION     Family History  Problem Relation Age of Onset   Stroke Mother    Diabetes Mother    Heart disease Mother        Valvular Cardiomyopathy   Pulmonary embolism Father        complication of amputation of leg for gangrene   Peripheral vascular disease Father    Alcohol abuse Father    Cancer Sister 40       colon:  2023 in remission   Bipolar disorder Sister    Cirrhosis Brother        alcoholic--had shunt   Lung cancer Brother        had removal of spot from lung   Alcohol abuse Brother        after death of son   Drug abuse Brother        Following death of son   Kidney disease Brother        not clear of cause:  exposure to something while in service in [place].  Peritoneal dialysis.  developed skin infection around catheter and died   Social History   Socioeconomic History   Marital status: Single    Spouse name: Not on file   Number of children: 0   Years of education: Not on file   Highest education level: Associate degree: occupational, Scientist, product/process development, or vocational program  Occupational History   Occupation: retired    Comment: Curator  Tobacco Use   Smoking status: Every Day    Current packs/day: 0.50    Average packs/day: 0.5 packs/day for 63.6 years (31.8 ttl pk-yrs)    Types: Cigarettes    Start date: 1962   Smokeless tobacco: Never   Tobacco comments:    Ordered patches today. 04/12/2023  Vaping Use   Vaping status: Never Used  Substance and Sexual Activity   Alcohol use: Yes    Comment: every other day:  swig of liquor    Drug use: Yes    Types: Cocaine    Comment: Last use two weeks ago   Sexual activity: Not Currently  Other Topics Concern   Not on file  Social History Narrative   Lives with his mother in [place] neighborhood.   He is retired,  but very active, helping friends and tinkering on cars.   Social Drivers of Corporate investment banker Strain: Low Risk  (04/12/2024)   Overall Financial Resource Strain (CARDIA)    Difficulty of Paying Living Expenses: Not hard at all  Food Insecurity: No Food Insecurity (04/12/2024)   Hunger Vital Sign    Worried About Running Out of Food in the Last Year: Never true    Ran Out of Food in the Last Year: Never true  Transportation Needs: No Transportation Needs (04/12/2024)   PRAPARE - Administrator, Civil Service (Medical): No    Lack of Transportation (Non-Medical): No  Physical Activity: Sufficiently Active (06/23/2018)   Exercise Vital Sign    Days of Exercise per Week: 7 days    Minutes of Exercise per Session: 120 min  Stress: Stress Concern Present (06/23/2018)   [employer] of Occupational Health - Occupational Stress Questionnaire    Feeling of Stress : To some extent  Social Connections: Somewhat Isolated (06/23/2018)   Social Connection and Isolation Panel    Frequency of Communication with Friends and Family: More than three times a week    Frequency of Social Gatherings with Friends and Family: More than three times a week    Attends Religious Services: Never    Database administrator or Organizations: Yes    Attends Engineer, structural: More than 4 times per year    Marital Status: Never married  Intimate Partner Violence: Not At Risk (04/12/2023)   Humiliation, Afraid, Rape, and Kick questionnaire    Fear of Current or Ex-Partner: No    Emotionally Abused: No    Physically Abused: No    Sexually Abused: No     Review of Systems  HENT:  Positive for congestion, postnasal drip, sore throat and voice  change.   Eyes:  Positive for discharge and itching. Negative for visual disturbance (Last eye check about 11 months ago.  Does have cataracts and was told he should have surgery.  Dr. Octavia.).  Respiratory:  Positive for cough. Negative for shortness of breath.   Cardiovascular:  Positive for leg swelling. Negative for chest pain and palpitations.  Gastrointestinal:  Negative for blood in stool (No melena).  Genitourinary:  Positive for hematuria (Traumatic catheterization on 4/5 in ED.  Hematuria for 2 d afterward has resolved.) and scrotal swelling (Right hydrocele causing significant pain).  Neurological:  Positive for numbness (Palms of hands.  Comes and goes.  Cannot say when he has it.). Negative for weakness.  Psychiatric/Behavioral:  Negative for dysphoric mood and suicidal ideas. The patient is not nervous/anxious.       Objective:   BP 138/80 (BP Location: Right Arm, Patient Position: Sitting, Cuff Size: Normal)   Pulse 68   Resp 16   Ht 5' 7 (1.702 m)   Wt 171 lb (77.6 kg)   SpO2 99%   BMI 26.78 kg/m   Physical Exam Constitutional:      Appearance: Normal appearance.  HENT:     Head: Normocephalic and atraumatic.     Right Ear: Tympanic membrane, ear canal and external ear normal.     Left Ear: Tympanic membrane, ear canal and external ear normal.     Nose: Nose normal.     Mouth/Throat:     Mouth: Mucous membranes are moist.     Pharynx: Oropharynx is clear.  Eyes:     Extraocular Movements: Extraocular movements intact.     Conjunctiva/sclera:  Conjunctivae normal.     Pupils: Pupils are equal, round, and reactive to light.     Comments: Bilateral cataracts  Neck:     Thyroid: No thyroid mass or thyromegaly.  Cardiovascular:     Rate and Rhythm: Normal rate and regular rhythm.     Heart sounds: S1 normal and S2 normal. No murmur heard.    No friction rub. No S3 or S4 sounds.     Comments: No carotid bruits.  Carotid, radial, femoral, DP and PT pulses normal  and equal.   Pulmonary:     Effort: Pulmonary effort is normal.     Breath sounds: Normal breath sounds and air entry.  Abdominal:     General: Abdomen is flat. Bowel sounds are normal.     Palpations: Abdomen is soft. There is no hepatomegaly, splenomegaly or mass.     Tenderness: There is no abdominal tenderness.     Hernia: No hernia is present.  Genitourinary:    Comments: Large hydrocele on right.   Patient declined rest of GU exam. Musculoskeletal:        General: Normal range of motion.     Cervical back: Normal range of motion and neck supple.     Right lower leg: 1+ Pitting Edema present.     Left lower leg: 1+ Pitting Edema present.  Feet:     Right foot:     Protective Sensation: 10 sites tested.  10 sites sensed.     Skin integrity: Dry skin present.     Left foot:     Protective Sensation: 10 sites tested.  10 sites sensed.     Skin integrity: Dry skin present.     Comments: Bilateral feet with edema as well. Lymphadenopathy:     Head:     Right side of head: No submental or submandibular adenopathy.     Left side of head: No submental or submandibular adenopathy.     Cervical: No cervical adenopathy.     Upper Body:     Right upper body: No supraclavicular or axillary adenopathy.     Left upper body: No supraclavicular or axillary adenopathy.     Lower Body: No right inguinal adenopathy. No left inguinal adenopathy.  Skin:    General: Skin is warm.     Capillary Refill: Capillary refill takes less than 2 seconds.     Findings: No rash.          Comments: Punctate area without surrounding erythema from tick removal some time ago.   Has adhesive from cardiac monitor on chest and right abdomen.  Neurological:     General: No focal deficit present.     Mental Status: He is alert and oriented to person, place, and time.     Cranial Nerves: Cranial nerves 2-12 are intact.     Sensory: Sensation is intact.     Motor: Motor function is intact.     Coordination:  Coordination is intact.     Gait: Gait is intact.     Deep Tendon Reflexes: Reflexes are normal and symmetric.  Psychiatric:        Speech: Speech normal.        Behavior: Behavior normal. Behavior is cooperative.      Assessment & Plan   CPE CBC,  CMP  2.  + FIT and sibling with history of colon cancer:  urging him to make the time to get colonoscopy done.  Concerned he is afraid of result as  he makes multiple excuses each time this is addressed as to why he cannot get this done.  Have sent him multiple times and he does not follow up.  3.  Recent opioid OD/Drug Use Disorder:  heroin or fentanyl  apparently mixed with cocaine and required Narcan  x 4 to resuscitate.   Met with Jeffrey Stafford, MSW intern with plans to discuss with SBT, LCSWA on her return. Urged him to get counseling.   Denies suicidal ideation or depressive symptoms.    4.  Tobacco Use Disorder: does not seem interested in addressing currently  5.  Right large testicular hydrocele:  call into urology to find out how to get him rescheduled for surgery.  Not clear if he missed an appt preparing for surgery.  6.  DM, diet controlled:  with CKD, start Jardiance  10 mg daily.  A1C and urine microalbumin/crea  7.  Hyperlipidemia:  FLP, CMP  8.  Hypertension:  Controlled  9.  Cataracts:  as per Dr. Octavia.

## 2024-04-13 LAB — LIPID PANEL W/O CHOL/HDL RATIO
Cholesterol, Total: 153 mg/dL (ref 100–199)
HDL: 52 mg/dL (ref >39)
LDL Chol Calc (NIH): 78 mg/dL (ref 0–99)
Triglycerides: 134 mg/dL (ref 0–149)
VLDL Cholesterol Cal: 23 mg/dL (ref 5–40)

## 2024-04-13 LAB — CBC WITH DIFFERENTIAL/PLATELET
Basophils Absolute: 0.1 10*3/uL (ref 0.0–0.2)
Basos: 1 %
EOS (ABSOLUTE): 0.2 10*3/uL (ref 0.0–0.4)
Eos: 3 %
Hematocrit: 35.8 % — ABNORMAL LOW (ref 37.5–51.0)
Hemoglobin: 11.8 g/dL — ABNORMAL LOW (ref 13.0–17.7)
Immature Grans (Abs): 0 10*3/uL (ref 0.0–0.1)
Immature Granulocytes: 0 %
Lymphocytes Absolute: 1.6 10*3/uL (ref 0.7–3.1)
Lymphs: 18 %
MCH: 29.7 pg (ref 26.6–33.0)
MCHC: 33 g/dL (ref 31.5–35.7)
MCV: 90 fL (ref 79–97)
Monocytes Absolute: 0.6 10*3/uL (ref 0.1–0.9)
Monocytes: 7 %
Neutrophils Absolute: 6.2 10*3/uL (ref 1.4–7.0)
Neutrophils: 71 %
Platelets: 346 10*3/uL (ref 150–450)
RBC: 3.97 x10E6/uL — ABNORMAL LOW (ref 4.14–5.80)
RDW: 13.2 % (ref 11.6–15.4)
WBC: 8.7 10*3/uL (ref 3.4–10.8)

## 2024-04-13 LAB — MICROALBUMIN / CREATININE URINE RATIO

## 2024-04-13 LAB — COMPREHENSIVE METABOLIC PANEL WITH GFR
ALT: 13 IU/L (ref 0–44)
AST: 19 IU/L (ref 0–40)
Albumin: 4 g/dL (ref 3.8–4.8)
Alkaline Phosphatase: 97 IU/L (ref 44–121)
BUN/Creatinine Ratio: 6 — ABNORMAL LOW (ref 10–24)
BUN: 6 mg/dL — ABNORMAL LOW (ref 8–27)
Bilirubin Total: 0.4 mg/dL (ref 0.0–1.2)
CO2: 24 mmol/L (ref 20–29)
Calcium: 8.9 mg/dL (ref 8.6–10.2)
Chloride: 103 mmol/L (ref 96–106)
Creatinine, Ser: 0.99 mg/dL (ref 0.76–1.27)
Globulin, Total: 2.4 g/dL (ref 1.5–4.5)
Glucose: 111 mg/dL — ABNORMAL HIGH (ref 70–99)
Potassium: 3.9 mmol/L (ref 3.5–5.2)
Sodium: 142 mmol/L (ref 134–144)
Total Protein: 6.4 g/dL (ref 6.0–8.5)
eGFR: 80 mL/min/{1.73_m2} (ref >59)

## 2024-04-13 LAB — HGB A1C W/O EAG: Hgb A1c MFr Bld: 6.4 % — ABNORMAL HIGH (ref 4.8–5.6)

## 2024-04-16 ENCOUNTER — Telehealth: Payer: Self-pay

## 2024-04-16 NOTE — Telephone Encounter (Signed)
 Spoke with Dr. Tova Fresh office with Debria Fang which reports patient cancelled due to no more pain.     Reported that patient did say he has pain on last appointment with pcp and could patient get scheduled   Debria Fang will send dr. Tova Fresh a message and will inform pcp and patient on results

## 2024-04-16 NOTE — Telephone Encounter (Signed)
 Patient is aware waiting on decision from dr. Secundino Dach

## 2024-05-11 ENCOUNTER — Other Ambulatory Visit: Payer: Self-pay | Admitting: Urology

## 2024-05-15 ENCOUNTER — Ambulatory Visit: Admitting: Internal Medicine

## 2024-05-15 NOTE — Progress Notes (Signed)
 COVID Vaccine received:  []  No [x]  Yes Date of any COVID positive Test in last 90 days:  PCP - Ronalee Cocking, MD at [facility] Cardiologist - none  Chest x-ray - 03-31-2024  1v  Epic  CT chest 05-16-2023  Epic EKG -  04-02-2024  Epic Stress Test -  ECHO -  Cardiac Cath -   Bowel Prep - [x]  No  []   Yes ______  Pacemaker / ICD device [x]  No []  Yes   Spinal Cord Stimulator:[x]  No []  Yes       History of Sleep Apnea? [x]  No []  Yes   CPAP used?- [x]  No []  Yes    Does the patient monitor blood sugar?   []  N/A   []  No []  Yes  Patient has: []  NO Hx DM   [x]  Pre-DM   []  DM1  []   DM2 Last A1c was: 6.4 on  04-12-2024    Jardiance - Hold x 72 hours, last dose: Monday 05-21-24  Blood Thinner / Instructions:none Aspirin  Instructions:  none  ERAS Protocol Ordered: [x]  No  []  Yes Patient is to be NPO after:  [place]   Dental hx: []  Dentures:  []  N/A      []  Bridge or Partial:                   []  Loose or Damaged teeth:   Comments: Surgery originally scheduled for 02-17-24 but patient no showed.   Activity level:Can go up a flight of stairs and perform activities of daily living without stopping and without symptoms of chest pain or shortness of breath.   Anesthesia review: HTN, Pre-DM, current cocaine use- recent OD of Heroin/ cocaine 03-31-24, CKD, GERD, smoker, Emphysemia,   Patient denies shortness of breath, fever, cough and chest pain at PAT appointment.  Patient verbalized understanding and agreement to the Pre-Surgical Instructions that were given to them at this PAT appointment. Patient was also educated of the need to review these PAT instructions again prior to his surgery.I reviewed the appropriate phone numbers to call if they have any and questions or concerns.

## 2024-05-15 NOTE — Patient Instructions (Signed)
 SURGICAL WAITING ROOM VISITATION Patients having surgery or a procedure may have no more than 2 support people in the waiting area - these visitors may rotate in the visitor waiting room.   If the patient needs to stay at the hospital during part of their recovery, the visitor guidelines for inpatient rooms apply.  PRE-OP VISITATION  Pre-op nurse will coordinate an appropriate time for 1 support person to accompany the patient in pre-op.  This support person may not rotate.  This visitor will be contacted when the time is appropriate for the visitor to come back in the pre-op area.  Please refer to the [facility] website for the visitor guidelines for Inpatients (after your surgery is over and you are in a regular room).  You are not required to quarantine at this time prior to your surgery. However, you must do this: Hand Hygiene often Do NOT share personal items Notify your provider if you are in close contact with someone who has COVID or you develop fever 100.4 or greater, new onset of sneezing, cough, sore throat, shortness of breath or body aches.  If you test positive for Covid or have been in contact with anyone that has tested positive in the last 10 days please notify you surgeon.    Your procedure is scheduled on:  FRIDAY  May 25, 2024  Report to [facility] Main Entrance: [address] entrance where the [employer] is available.   Report to admitting at: 05:15 AM  Call this number if you have any questions or problems the morning of surgery [phone]  DO NOT EAT OR DRINK ANYTHING AFTER MIDNIGHT THE NIGHT PRIOR TO YOUR SURGERY / PROCEDURE.   FOLLOW  ANY ADDITIONAL PRE OP INSTRUCTIONS YOU RECEIVED FROM YOUR SURGEON'S OFFICE!!!   Oral Hygiene is also important to reduce your risk of infection.        Remember - BRUSH YOUR TEETH THE MORNING OF SURGERY WITH YOUR REGULAR TOOTHPASTE  Do NOT smoke after Midnight the night before surgery.  STOP TAKING all Vitamins,  Herbs and supplements 1 week before your surgery.   JARDIANCE - Stop taking 72 hours before your surgery. Last Dose will be taken on Monday  May 21, 2024  Take ONLY these medicines the morning of surgery with A SIP OF WATER: Metoprolol .  You may not have any metal on your body including  jewelry, and body piercing  Do not wear lotions, powders, cologne, or deodorant  Men may shave face and neck.  Contacts, Hearing Aids, dentures or bridgework may not be worn into surgery. DENTURES WILL BE REMOVED PRIOR TO SURGERY PLEASE DO NOT APPLY "Poly grip" OR ADHESIVES!!!  Patients discharged on the day of surgery will not be allowed to drive home.  Someone NEEDS to stay with you for the first 24 hours after anesthesia.  Do not bring your home medications to the hospital. The Pharmacy will dispense medications listed on your medication list to you during your admission in the Hospital.  Please read over the following fact sheets you were given: IF YOU HAVE QUESTIONS ABOUT YOUR PRE-OP INSTRUCTIONS, PLEASE CALL [phone].   [place] - Preparing for Surgery Before surgery, you can play an important role.  Because skin is not sterile, your skin needs to be as free of germs as possible.  You can reduce the number of germs on your skin by washing with CHG (chlorahexidine gluconate) soap before surgery.  CHG is an antiseptic cleaner which kills germs and  bonds with the skin to continue killing germs even after washing. Please DO NOT use if you have an allergy to CHG or antibacterial soaps.  If your skin becomes reddened/irritated stop using the CHG and inform your nurse when you arrive at Short Stay. Do not shave (including legs and underarms) for at least 48 hours prior to the first CHG shower.  You may shave your face/neck.  Please follow these instructions carefully:  1.  Shower with CHG Soap the night before surgery and the  morning of surgery.  2.  If you choose to wash your hair, wash your hair  first as usual with your normal  shampoo.  3.  After you shampoo, rinse your hair and body thoroughly to remove the shampoo.                             4.  Use CHG as you would any other liquid soap.  You can apply chg directly to the skin and wash.  Gently with a scrungie or clean washcloth.  5.  Apply the CHG Soap to your body ONLY FROM THE NECK DOWN.   Do not use on face/ open                           Wound or open sores. Avoid contact with eyes, ears mouth and genitals (private parts).                       Wash face,  Genitals (private parts) with your normal soap.             6.  Wash thoroughly, paying special attention to the area where your  surgery  will be performed.  7.  Thoroughly rinse your body with warm water from the neck down.  8.  DO NOT shower/wash with your normal soap after using and rinsing off the CHG Soap.            9.  Pat yourself dry with a clean towel.            10.  Wear clean pajamas.            11.  Place clean sheets on your bed the night of your first shower and do not  sleep with pets.  ON THE DAY OF SURGERY : Do not apply any lotions/deodorants the morning of surgery.  Please wear clean clothes to the hospital/surgery center.    FAILURE TO FOLLOW THESE INSTRUCTIONS MAY RESULT IN THE CANCELLATION OF YOUR SURGERY  PATIENT SIGNATURE_________________________________  NURSE SIGNATURE__________________________________  ________________________________________________________________________

## 2024-05-16 ENCOUNTER — Encounter (HOSPITAL_COMMUNITY)
Admission: RE | Admit: 2024-05-16 | Discharge: 2024-05-16 | Disposition: A | Discharge status: Home / Self Care | Source: Ambulatory Visit | Attending: Anesthesiology | Admitting: Anesthesiology

## 2024-05-16 DIAGNOSIS — F1410 Cocaine abuse, uncomplicated: Secondary | ICD-10-CM

## 2024-05-16 DIAGNOSIS — Z79899 Other long term (current) drug therapy: Secondary | ICD-10-CM

## 2024-05-16 DIAGNOSIS — Z01818 Encounter for other preprocedural examination: Secondary | ICD-10-CM

## 2024-05-16 DIAGNOSIS — I10 Essential (primary) hypertension: Secondary | ICD-10-CM

## 2024-05-17 ENCOUNTER — Ambulatory Visit (INDEPENDENT_AMBULATORY_CARE_PROVIDER_SITE_OTHER): Admitting: Internal Medicine

## 2024-05-17 DIAGNOSIS — I10 Essential (primary) hypertension: Secondary | ICD-10-CM

## 2024-05-17 NOTE — Progress Notes (Unsigned)
 Patient came for blood work and ankle swelling down to feet.   Reports does take lasix  daily.  Denies any breathing issues.    Per Dr. Jayne Mews patient needs to continue on lasix  daily.

## 2024-05-18 LAB — BASIC METABOLIC PANEL WITH GFR
BUN/Creatinine Ratio: 8 — ABNORMAL LOW (ref 10–24)
BUN: 7 mg/dL — ABNORMAL LOW (ref 8–27)
CO2: 22 mmol/L (ref 20–29)
Calcium: 9.2 mg/dL (ref 8.6–10.2)
Chloride: 102 mmol/L (ref 96–106)
Creatinine, Ser: 0.86 mg/dL (ref 0.76–1.27)
Glucose: 142 mg/dL — ABNORMAL HIGH (ref 70–99)
Potassium: 3.8 mmol/L (ref 3.5–5.2)
Sodium: 140 mmol/L (ref 134–144)
eGFR: 91 mL/min/{1.73_m2} (ref >59)

## 2024-05-22 ENCOUNTER — Encounter (HOSPITAL_COMMUNITY): Payer: Self-pay | Admitting: Urology

## 2024-05-22 ENCOUNTER — Other Ambulatory Visit: Payer: Self-pay

## 2024-05-22 NOTE — Progress Notes (Addendum)
 COVID Vaccine Completed: Yes  Date of COVID positive in last 90 days: No  PCP - PCP - Ronalee Cocking, MD at [facility]  Cardiologist -  N/A  Chest x-ray - 03-31-24 Epic EKG - 04-02-24 Epic Stress Test - N/A ECHO - N/A Cardiac Cath - N/A Pacemaker/ICD device last checked: Spinal Cord Stimulator: N/A  Bowel Prep - N/A  Sleep Study - N/A CPAP -   Prediabetes Fasting Blood Sugar - does not check Checks Blood Sugar _____ times a day  Last dose of GLP1 agonist-  N/A GLP1 instructions:  Hold 7 days before surgery    Jardiance   Last dose of SGLT-2 inhibitors-  05-22-24 SGLT-2 instructions:  Advised to hold until after surgery   Blood Thinner Instructions:  N/A: Aspirin  Instructions: Last Dose:  Activity level:  Can go up a flight of stairs and perform activities of daily living without stopping and without symptoms of chest pain or shortness of breath.  Anesthesia review:  Recent overdose, HTN, PreDM, emphysema, smoker, alcohol use  Patient denies shortness of breath, fever, cough and chest pain at PAT appointment  Patient verbalized understanding of instructions that were given to them at the PAT appointment. Patient was also instructed that they will need to review over the PAT instructions again at home before surgery.

## 2024-05-22 NOTE — Anesthesia Preprocedure Evaluation (Signed)
 Anesthesia Evaluation  Patient identified by MRN, date of birth, ID band Patient awake    Reviewed: Allergy & Precautions, NPO status , Patient's Chart, lab work & pertinent test results  Airway Mallampati: I  TM Distance: >3 FB Neck ROM: Full    Dental  (+) Dental Advisory Given, Edentulous Upper, Edentulous Lower   Pulmonary neg shortness of breath, neg sleep apnea, COPD (no recent flares), neg recent URI, Current Smoker   Pulmonary exam normal breath sounds clear to auscultation       Cardiovascular hypertension (furosemide , metoprolol ), Pt. on home beta blockers (-) angina (-) Past MI, (-) Cardiac Stents and (-) CABG (-) dysrhythmias  Rhythm:Regular Rate:Normal  HLD   Neuro/Psych  PSYCHIATRIC DISORDERS Anxiety Depression    negative neurological ROS     GI/Hepatic ,GERD  ,,(+)     substance abuse (recent overdose, patient reports last use 2 weeks ago; reports drinking less than a shot daily)  alcohol use and cocaine use  Endo/Other  diabetes (Hgb A1c 6.4), Well Controlled, Type 2    Renal/GU CRFRenal disease     Musculoskeletal  (+) Arthritis ,    Abdominal   Peds  Hematology  (+) Blood dyscrasia, anemia Lab Results      Component                Value               Date                      WBC                      8.7                 04/12/2024                HGB                      11.8 (L)            04/12/2024                HCT                      35.8 (L)            04/12/2024                MCV                      90                  04/12/2024                PLT                      346                 04/12/2024              Anesthesia Other Findings   Reproductive/Obstetrics                             Anesthesia Physical Anesthesia Plan  ASA: 3  Anesthesia Plan: General   Post-op Pain Management: Tylenol  PO (pre-op)*   Induction: Intravenous  PONV Risk Score  and Plan: 1  and Ondansetron , Dexamethasone  and Treatment may vary due to age or medical condition  Airway Management Planned: LMA  Additional Equipment:   Intra-op Plan:   Post-operative Plan: Extubation in OR  Informed Consent: I have reviewed the patients History and Physical, chart, labs and discussed the procedure including the risks, benefits and alternatives for the proposed anesthesia with the patient or authorized representative who has indicated his/her understanding and acceptance.     Dental advisory given  Plan Discussed with: CRNA and Anesthesiologist  Anesthesia Plan Comments: (See PAT note from 5/27  Risks of general anesthesia discussed including, but not limited to, sore throat, hoarse voice, chipped/damaged teeth, injury to vocal cords, nausea and vomiting, allergic reactions, lung infection, heart attack, stroke, and death. All questions answered. )        Anesthesia Quick Evaluation

## 2024-05-22 NOTE — Progress Notes (Signed)
 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 PMH of smoking, HTN, COPD (by CT), hx of cocaine and opioid abuse, GERD, prediabetes, anemia, anxiety, depression, arthritis.  Pt had ED visit on 4/5 for accidental overdose with powder cocaine laced with ?heroin vs fentanyl    Patient intermittently follows with PCP. Last seen by Dr. Jayne Mews on 4/17. He has mild chronic anemia and +FIT test. Colonoscopy has been recommended.  LD Jardiance : 5/27  VS: Ht 5\' 8"  (1.727 m)   Wt 75.8 kg   BMI 25.39 kg/m   PROVIDERS: Ronalee Cocking, MD   LABS: Labs reviewed: Acceptable for surgery. (all labs ordered are listed, but only abnormal results are displayed)  Labs Reviewed - No data to display   IMAGES: CXR 03/31/24:  FINDINGS: The heart size and mediastinal contours are within normal limits. Both lungs are clear. The visualized skeletal structures are unremarkable.   IMPRESSION: No active disease.   CT Chest 05/16/23:  IMPRESSION: 1. Lung-RADS 2, benign appearance or behavior. Continue annual screening with low-dose chest CT without contrast in 12 months. 2. Aortic Atherosclerosis (ICD10-I70.0) and Emphysema (ICD10-J43.9).  EKG 04/02/24:  Sinus bradycardia, rate 55  CV:  Past Medical History:  Diagnosis Date   Allergy    seasonal allergies   Anemia    hx of   Anxiety    hx of   Arthritis    generalized   Cocaine substance abuse (HCC) 2014-2015   Depression    hx of   Eczema    GERD (gastroesophageal reflux disease)    with certain foods   Hyperlipidemia    not taking  meds at this time   Hypertension    not taking meds at this time   Nocturia    Pre-diabetes    Right hydrocele     Past Surgical History:  Procedure Laterality Date   HYDROCELE EXCISION Right 12/05/2015   Procedure: RIGHT HYDROCELECTOMY ADULT;  Surgeon: Osborn Blaze, MD;  Location: [facility];  Service: Urology;  Laterality: Right;   ORCHIOPEXY Right 12/05/2015   Procedure: ORCHIOPEXY/ SPERMATOCELECTOMY  ADULT;  Surgeon: Osborn Blaze, MD;  Location: [facility];  Service: Urology;  Laterality: Right;   TONSILLECTOMY     WISDOM TOOTH EXTRACTION      MEDICATIONS: No current facility-administered medications for this encounter.    empagliflozin  (JARDIANCE ) 10 MG TABS tablet   metoprolol  succinate (TOPROL -XL) 25 MG 24 hr tablet   metoprolol  succinate (TOPROL -XL) 50 MG 24 hr tablet   rosuvastatin  (CRESTOR ) 10 MG tablet   triamcinolone  cream (KENALOG ) 0.1 %   trolamine salicylate (ASPERCREME) 10 % cream   furosemide  (LASIX ) 20 MG tablet   naloxone  (NARCAN ) 0.4 MG/ML injection   tamsulosin  (FLOMAX ) 0.4 MG CAPS capsule    Jeffrey Pennant, PA-C [facility] Short Stay/Anesthesiology [facility] Phone [phone] 05/22/2024 1:29 PM

## 2024-05-23 ENCOUNTER — Ambulatory Visit: Payer: Self-pay | Admitting: Internal Medicine

## 2024-05-23 NOTE — Progress Notes (Signed)
 States breathing is fine. Legs are now without edema  On exam:  No edema of LE/ankles/feet

## 2024-05-25 ENCOUNTER — Ambulatory Visit (HOSPITAL_COMMUNITY): Payer: Self-pay | Admitting: Medical

## 2024-05-25 ENCOUNTER — Encounter (HOSPITAL_COMMUNITY)
Admission: RE | Disposition: A | Discharge status: Home / Self Care | Payer: Self-pay | Source: Ambulatory Visit | Attending: Urology

## 2024-05-25 ENCOUNTER — Ambulatory Visit (HOSPITAL_COMMUNITY)
Admission: RE | Admit: 2024-05-25 | Discharge: 2024-05-25 | Disposition: A | Discharge status: Home / Self Care | Source: Ambulatory Visit | Attending: Urology | Admitting: Urology

## 2024-05-25 ENCOUNTER — Other Ambulatory Visit: Payer: Self-pay | Admitting: Internal Medicine

## 2024-05-25 ENCOUNTER — Encounter (HOSPITAL_COMMUNITY): Payer: Self-pay | Admitting: Urology

## 2024-05-25 DIAGNOSIS — Z7984 Long term (current) use of oral hypoglycemic drugs: Secondary | ICD-10-CM | POA: Diagnosis not present

## 2024-05-25 DIAGNOSIS — N433 Hydrocele, unspecified: Secondary | ICD-10-CM

## 2024-05-25 DIAGNOSIS — M1990 Unspecified osteoarthritis, unspecified site: Secondary | ICD-10-CM | POA: Diagnosis not present

## 2024-05-25 DIAGNOSIS — Z79899 Other long term (current) drug therapy: Secondary | ICD-10-CM | POA: Diagnosis not present

## 2024-05-25 DIAGNOSIS — E119 Type 2 diabetes mellitus without complications: Secondary | ICD-10-CM | POA: Diagnosis not present

## 2024-05-25 DIAGNOSIS — N5089 Other specified disorders of the male genital organs: Secondary | ICD-10-CM | POA: Insufficient documentation

## 2024-05-25 DIAGNOSIS — N4341 Spermatocele of epididymis, single: Secondary | ICD-10-CM

## 2024-05-25 DIAGNOSIS — F419 Anxiety disorder, unspecified: Secondary | ICD-10-CM | POA: Diagnosis not present

## 2024-05-25 DIAGNOSIS — I700 Atherosclerosis of aorta: Secondary | ICD-10-CM | POA: Diagnosis not present

## 2024-05-25 DIAGNOSIS — I10 Essential (primary) hypertension: Secondary | ICD-10-CM | POA: Diagnosis not present

## 2024-05-25 DIAGNOSIS — D649 Anemia, unspecified: Secondary | ICD-10-CM | POA: Diagnosis not present

## 2024-05-25 DIAGNOSIS — F32A Depression, unspecified: Secondary | ICD-10-CM | POA: Diagnosis not present

## 2024-05-25 DIAGNOSIS — F17210 Nicotine dependence, cigarettes, uncomplicated: Secondary | ICD-10-CM

## 2024-05-25 DIAGNOSIS — J439 Emphysema, unspecified: Secondary | ICD-10-CM | POA: Insufficient documentation

## 2024-05-25 DIAGNOSIS — F1410 Cocaine abuse, uncomplicated: Secondary | ICD-10-CM

## 2024-05-25 DIAGNOSIS — J449 Chronic obstructive pulmonary disease, unspecified: Secondary | ICD-10-CM | POA: Diagnosis not present

## 2024-05-25 DIAGNOSIS — K219 Gastro-esophageal reflux disease without esophagitis: Secondary | ICD-10-CM | POA: Insufficient documentation

## 2024-05-25 DIAGNOSIS — Z01818 Encounter for other preprocedural examination: Secondary | ICD-10-CM

## 2024-05-25 DIAGNOSIS — E785 Hyperlipidemia, unspecified: Secondary | ICD-10-CM | POA: Insufficient documentation

## 2024-05-25 HISTORY — PX: ORCHIOPEXY: SHX479

## 2024-05-25 HISTORY — PX: HYDROCELE EXCISION: SHX482

## 2024-05-25 LAB — COMPREHENSIVE METABOLIC PANEL WITH GFR
ALT: 12 U/L (ref 0–44)
AST: 19 U/L (ref 15–41)
Albumin: 3.1 g/dL — ABNORMAL LOW (ref 3.5–5.0)
Alkaline Phosphatase: 71 U/L (ref 38–126)
Anion gap: 6 (ref 5–15)
BUN: 22 mg/dL (ref 8–23)
CO2: 26 mmol/L (ref 22–32)
Calcium: 7.9 mg/dL — ABNORMAL LOW (ref 8.9–10.3)
Chloride: 103 mmol/L (ref 98–111)
Creatinine, Ser: 1.05 mg/dL (ref 0.61–1.24)
GFR, Estimated: >60 mL/min (ref >60)
Glucose, Bld: 102 mg/dL — ABNORMAL HIGH (ref 70–99)
Potassium: 3.5 mmol/L (ref 3.5–5.1)
Sodium: 135 mmol/L (ref 135–145)
Total Bilirubin: 0.8 mg/dL (ref 0.0–1.2)
Total Protein: 6.2 g/dL — ABNORMAL LOW (ref 6.5–8.1)

## 2024-05-25 LAB — CBC
HCT: 31.7 % — ABNORMAL LOW (ref 39.0–52.0)
Hemoglobin: 10 g/dL — ABNORMAL LOW (ref 13.0–17.0)
MCH: 29.2 pg (ref 26.0–34.0)
MCHC: 31.5 g/dL (ref 30.0–36.0)
MCV: 92.4 fL (ref 80.0–100.0)
Platelets: 228 10*3/uL (ref 150–400)
RBC: 3.43 MIL/uL — ABNORMAL LOW (ref 4.22–5.81)
RDW: 13.7 % (ref 11.5–15.5)
WBC: 6.8 10*3/uL (ref 4.0–10.5)
nRBC: 0 % (ref 0.0–0.2)

## 2024-05-25 LAB — GLUCOSE, CAPILLARY: Glucose-Capillary: 83 mg/dL (ref 70–99)

## 2024-05-25 SURGERY — HYDROCELECTOMY
Anesthesia: General | Laterality: Right

## 2024-05-25 MED ORDER — ACETAMINOPHEN 500 MG PO TABS
1000.0000 mg | ORAL_TABLET | Freq: Once | ORAL | Status: AC
  Administered 2024-05-25: 1000 mg via ORAL
  Filled 2024-05-25: qty 2

## 2024-05-25 MED ORDER — DEXAMETHASONE SODIUM PHOSPHATE 10 MG/ML IJ SOLN
INTRAMUSCULAR | Status: AC
  Filled 2024-05-25: qty 1

## 2024-05-25 MED ORDER — GLYCOPYRROLATE 0.2 MG/ML IJ SOLN
INTRAMUSCULAR | Status: DC | PRN
  Administered 2024-05-25: .15 mg via INTRAVENOUS

## 2024-05-25 MED ORDER — DEXAMETHASONE SODIUM PHOSPHATE 4 MG/ML IJ SOLN
INTRAMUSCULAR | Status: DC | PRN
  Administered 2024-05-25: 5 mg via INTRAVENOUS

## 2024-05-25 MED ORDER — FENTANYL CITRATE (PF) 100 MCG/2ML IJ SOLN
INTRAMUSCULAR | Status: DC | PRN
  Administered 2024-05-25 (×2): 25 ug via INTRAVENOUS
  Administered 2024-05-25 (×2): 50 ug via INTRAVENOUS

## 2024-05-25 MED ORDER — PHENYLEPHRINE HCL (PRESSORS) 10 MG/ML IV SOLN
INTRAVENOUS | Status: DC | PRN
Start: 2024-05-25 — End: 2024-05-25
  Administered 2024-05-25 (×3): 40 ug via INTRAVENOUS
  Administered 2024-05-25: 80 ug via INTRAVENOUS
  Administered 2024-05-25 (×4): 40 ug via INTRAVENOUS

## 2024-05-25 MED ORDER — EPHEDRINE 5 MG/ML INJ
INTRAVENOUS | Status: AC
Start: 2024-05-25 — End: ?
  Filled 2024-05-25: qty 5

## 2024-05-25 MED ORDER — FENTANYL CITRATE PF 50 MCG/ML IJ SOSY: 25.0000 ug | PREFILLED_SYRINGE | INTRAMUSCULAR | Status: DC | PRN

## 2024-05-25 MED ORDER — ONDANSETRON HCL 4 MG/2ML IJ SOLN
INTRAMUSCULAR | Status: DC | PRN
  Administered 2024-05-25: 4 mg via INTRAVENOUS

## 2024-05-25 MED ORDER — PROPOFOL 10 MG/ML IV BOLUS
INTRAVENOUS | Status: AC
  Filled 2024-05-25: qty 20

## 2024-05-25 MED ORDER — INSULIN ASPART 100 UNIT/ML IJ SOLN: 0.0000 [IU] | INTRAMUSCULAR | Status: DC | PRN

## 2024-05-25 MED ORDER — MIDAZOLAM HCL 2 MG/2ML IJ SOLN
INTRAMUSCULAR | Status: AC
Start: 2024-05-25 — End: ?
  Filled 2024-05-25: qty 2

## 2024-05-25 MED ORDER — FENTANYL CITRATE (PF) 250 MCG/5ML IJ SOLN
INTRAMUSCULAR | Status: AC
  Filled 2024-05-25: qty 5

## 2024-05-25 MED ORDER — GLYCOPYRROLATE 0.2 MG/ML IJ SOLN
INTRAMUSCULAR | Status: AC
  Filled 2024-05-25: qty 2

## 2024-05-25 MED ORDER — EPHEDRINE SULFATE (PRESSORS) 50 MG/ML IJ SOLN
INTRAMUSCULAR | Status: DC | PRN
  Administered 2024-05-25: 10 mg via INTRAVENOUS

## 2024-05-25 MED ORDER — CHLORHEXIDINE GLUCONATE 0.12 % MT SOLN
15.0000 mL | Freq: Once | OROMUCOSAL | Status: AC
  Administered 2024-05-25: 15 mL via OROMUCOSAL

## 2024-05-25 MED ORDER — PROPOFOL 10 MG/ML IV BOLUS
INTRAVENOUS | Status: DC | PRN
  Administered 2024-05-25: 15 ug/kg/min via INTRAVENOUS
  Administered 2024-05-25: 100 mg via INTRAVENOUS
  Administered 2024-05-25: 50 mg via INTRAVENOUS

## 2024-05-25 MED ORDER — BUPIVACAINE HCL (PF) 0.5 % IJ SOLN
INTRAMUSCULAR | Status: AC
Start: 2024-05-25 — End: ?
  Filled 2024-05-25: qty 30

## 2024-05-25 MED ORDER — ORAL CARE MOUTH RINSE: 15.0000 mL | Freq: Once | OROMUCOSAL | Status: AC

## 2024-05-25 MED ORDER — OXYCODONE HCL 5 MG PO TABS: 5.0000 mg | ORAL_TABLET | Freq: Once | ORAL | Status: DC | PRN

## 2024-05-25 MED ORDER — BUPIVACAINE-EPINEPHRINE (PF) 0.25% -1:200000 IJ SOLN
INTRAMUSCULAR | Status: AC
  Filled 2024-05-25: qty 30

## 2024-05-25 MED ORDER — PHENYLEPHRINE 80 MCG/ML (10ML) SYRINGE FOR IV PUSH (FOR BLOOD PRESSURE SUPPORT)
PREFILLED_SYRINGE | INTRAVENOUS | Status: AC
  Filled 2024-05-25: qty 10

## 2024-05-25 MED ORDER — LACTATED RINGERS IV SOLN: INTRAVENOUS | Status: DC

## 2024-05-25 MED ORDER — BUPIVACAINE HCL (PF) 0.5 % IJ SOLN
INTRAMUSCULAR | Status: DC | PRN
  Administered 2024-05-25: 30 mL

## 2024-05-25 MED ORDER — LIDOCAINE HCL (PF) 2 % IJ SOLN
INTRAMUSCULAR | Status: AC
Start: 2024-05-25 — End: ?
  Filled 2024-05-25: qty 5

## 2024-05-25 MED ORDER — OXYCODONE-ACETAMINOPHEN 5-325 MG PO TABS: 1.0000 | ORAL_TABLET | Freq: Four times a day (QID) | ORAL | 0 refills | Status: DC | PRN

## 2024-05-25 MED ORDER — CLINDAMYCIN PHOSPHATE 900 MG/50ML IV SOLN
900.0000 mg | INTRAVENOUS | Status: AC
  Administered 2024-05-25: 900 mg via INTRAVENOUS
  Filled 2024-05-25: qty 50

## 2024-05-25 MED ORDER — OXYCODONE HCL 5 MG/5ML PO SOLN: 5.0000 mg | Freq: Once | ORAL | Status: DC | PRN

## 2024-05-25 MED ORDER — ONDANSETRON HCL 4 MG/2ML IJ SOLN
INTRAMUSCULAR | Status: AC
  Filled 2024-05-25: qty 2

## 2024-05-25 MED ORDER — SENNOSIDES-DOCUSATE SODIUM 8.6-50 MG PO TABS: 1.0000 | ORAL_TABLET | Freq: Two times a day (BID) | ORAL | 0 refills | Status: DC

## 2024-05-25 MED ORDER — AMISULPRIDE (ANTIEMETIC) 5 MG/2ML IV SOLN: 10.0000 mg | Freq: Once | INTRAVENOUS | Status: DC | PRN

## 2024-05-25 MED ORDER — LIDOCAINE HCL (CARDIAC) PF 100 MG/5ML IV SOSY
PREFILLED_SYRINGE | INTRAVENOUS | Status: DC | PRN
  Administered 2024-05-25 (×2): 50 mg via INTRAVENOUS

## 2024-05-25 SURGICAL SUPPLY — 39 items
BAG COUNTER SPONGE SURGICOUNT (BAG) ×4 IMPLANT
BLADE CLIPPER SENSICLIP SURGIC (BLADE) ×2 IMPLANT
BLADE HEX COATED 2.75 (ELECTRODE) ×2 IMPLANT
BLADE SURG 15 STRL LF DISP TIS (BLADE) ×2 IMPLANT
BNDG GAUZE DERMACEA FLUFF 4 (GAUZE/BANDAGES/DRESSINGS) ×2 IMPLANT
BRIEF MESH DISP LRG (UNDERPADS AND DIAPERS) ×2 IMPLANT
COVER BACK TABLE 60X90IN (DRAPES) ×2 IMPLANT
COVER MAYO STAND STRL (DRAPES) ×2 IMPLANT
COVER SURGICAL LIGHT HANDLE (MISCELLANEOUS) IMPLANT
DERMABOND ADVANCED .7 DNX12 (GAUZE/BANDAGES/DRESSINGS) IMPLANT
DISSECTOR ROUND CHERRY 3/8 STR (MISCELLANEOUS) IMPLANT
DRAIN PENROSE 0.25X18 (DRAIN) ×2 IMPLANT
DRAPE LAPAROTOMY T 98X78 PEDS (DRAPES) ×2 IMPLANT
DRSG TEGADERM 4X4.75 (GAUZE/BANDAGES/DRESSINGS) IMPLANT
ELECT REM PT RETURN 15FT ADLT (MISCELLANEOUS) ×2 IMPLANT
GAUZE PAD ABD 8X10 STRL (GAUZE/BANDAGES/DRESSINGS) IMPLANT
GLOVE BIO SURGEON STRL SZ7.5 (GLOVE) ×2 IMPLANT
GOWN SRG XL LVL 4 BRTHBL STRL (GOWNS) ×2 IMPLANT
KIT BASIN OR (CUSTOM PROCEDURE TRAY) ×2 IMPLANT
KIT TURNOVER KIT A (KITS) IMPLANT
NDL HYPO 22X1.5 SAFETY MO (MISCELLANEOUS) IMPLANT
NDL HYPO 25X1 1.5 SAFETY (NEEDLE) ×2 IMPLANT
NEEDLE HYPO 22X1.5 SAFETY MO (MISCELLANEOUS) ×2 IMPLANT
NEEDLE HYPO 25X1 1.5 SAFETY (NEEDLE) IMPLANT
PACK GENERAL/GYN (CUSTOM PROCEDURE TRAY) IMPLANT
PENCIL SMOKE EVACUATOR (MISCELLANEOUS) IMPLANT
SUT ETHILON 3 0 PS 1 (SUTURE) IMPLANT
SUT MNCRL AB 4-0 PS2 18 (SUTURE) ×2 IMPLANT
SUT PROLENE 4 0 PS 2 18 (SUTURE) IMPLANT
SUT VIC AB 2-0 SH 27XBRD (SUTURE) IMPLANT
SUT VIC AB 3-0 SH 27X BRD (SUTURE) IMPLANT
SUT VIC AB 3-0 SH 27XBRD (SUTURE) ×2 IMPLANT
SUT VIC AB 4-0 SH 27XANBCTRL (SUTURE) ×2 IMPLANT
SYR 10ML LL (SYRINGE) IMPLANT
SYR CONTROL 10ML LL (SYRINGE) ×2 IMPLANT
TOWEL OR 17X26 10 PK STRL BLUE (TOWEL DISPOSABLE) ×4 IMPLANT
TUBING CONNECTING 10 (TUBING) ×2 IMPLANT
WATER STERILE IRR 500ML POUR (IV SOLUTION) IMPLANT
YANKAUER SUCT BULB TIP NO VENT (SUCTIONS) ×2 IMPLANT

## 2024-05-25 NOTE — Op Note (Signed)
 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  POSTOPERATIVE DIAGNOSES: 1.  Recurrent right hydrocele with large scrotal redundancy. 2.  Excess right testicular mobility.  PROCEDURES PERFORMED: 1.  Right hydrocelectomy. 2.  Right orchiopexy.  ESTIMATED BLOOD LOSS:  Nil.  COMPLICATIONS:  None.  SPECIMENS: Redundant scrotal skin for discard.  FINDINGS: 1.  Significant redundant scrotal skin and right testicular mobility. 2.  Approximately 150 mL right multiloculated recurrent hydrocele.  DRAIN:  Penrose drain to scrotal drainage.  INDICATIONS FOR PROCEDURE: The patient is a 73 year old man with a history of large hydrocele that was repaired years ago.  He has had a partial recurrence on the right side with approximately 150 mL multiloculated hydrocele including hydrocele of the cord, that has become  increasing bother.  He also has significant redundant scrotal skin.  Options were discussed of management with the patient including observation versus office-based therapies with aspiration versus more definitive management with right hydrocelectomy and  tissue rearrangement and he wished to proceed with the latter.  Informed consent was obtained and placed in the medical record.  PROCEDURE IN DETAIL:  The patient being Braheem Tomasik was verified and the procedure being right hydrocelectomy was confirmed.  Procedure timeout was performed.  Intravenous antibiotics administered.  General LMA anesthesia was induced.  The patient was  placed into a supine position.  A sterile field was created, after clipper shaving, prepping and draping the patient's penis, perineum and proximal thighs and scrotum using iodine.  Exam under anesthesia  again revealed significant scrotal tissue  redundancy and plan was to improve this as well as address the right hydrocele.  As such, an elliptical incision was made from the right side to the left side with the long axis being approximately 12 cm, short axis being approximately 6 cm.  This  elliptical portion of the skin was carefully dissected away from the underlying tissue using cautery dissection and set aside for discard.  The right scrotal compartment was then entered formally and the right testis and hydrocele was delivered in the  operative field.  As anticipated, the hydrocele was somewhat multiloculated and did also comprise some component of hydrocele of the cord.  There were two dominant locules that were carefully incised and drained and total volume was approximately 150 mL.   The redundant portion of the hydrocele sac was excised for discard.  It was then everted to assess mucosal edges or outward facing and then reapproximated on itself using running Vicryl.  The penis was elevated to avoid excessive tension on the cord.   This inherently led to significant redundancy of the right cord and mobility of the right testis and I was concerned about possible future torsion.  Therefore, formal orchiopexy was performed using 4-0 Prolene from the lateral mesorchium testis to the  lateral intrascrotal leaflet on the right side x 2.  This was an excellent fixation of the right testis.  A small counter incision was made on the right side of the scrotum through which a Penrose drain was placed.  The dartos was then reapproximated  using running 3-0 Vicryl over the Penrose drain.  The skin was reapproximated using running 4-0 Monocryl.  This resulted in excellent tissue apposition and drastically improved cosmesis as much of the redundancy of the scrotal tissue had  been excised.     0.5% bupivacaine  was instilled along the edges of the incision.  An additional 10 mL in the area of the right external ring.   A dressing of ABD pad followed by Kerlix fluff and mesh underwear was placed.  Procedure was terminated.  The  patient tolerated the procedure well.  There were no immediate periprocedural complications.  The patient will be taken to the post anesthesia care unit in stable condition.  Plan for discharge home.   MUK D: 05/25/2024 8:18:53 am T: 05/25/2024 9:34:00 am  JOB: [id number]/ [id number]

## 2024-05-25 NOTE — H&P (Signed)
 Jeffrey Stafford is an 73 y.o. male.    Chief Complaint: Pre-OP RIGHT Hydrocelectomy / Substraction Scrotoplasty  HPI:   1 - Right Hydrocele - s/p uncomplicated Rt hydrocelectomy 2016. Path bengin. Was huge (12cm). Repeat exam 01/2018 with some recurrence, approx 6cm likely hydrocele. Some bother at night with trying to sleep. Scrotal US  02/2018 confirms likely spermatoceles v. parital hydrocele recurrence, no solid masses or hernias.    PMH sig for HTN, HLD. NO CV diseease / blood thinners. About 30PY smoker, still smokes some. 92yo mom lives with him he is her primary caretaker. His PCP is Ronalee Cocking with [facility].    Today " Suzanne " is seen to proceed with RIGHT hydrocelectomy / subsraction scrotoplasty. NO interval fevers. A1c 6s, BMP normal most recently.    Past Medical History:  Diagnosis Date   Allergy    seasonal allergies   Anemia    hx of   Anxiety    hx of   Arthritis    generalized   Cocaine substance abuse (HCC) 2014-2015   Depression    hx of   Eczema    GERD (gastroesophageal reflux disease)    with certain foods   Hyperlipidemia    not taking meds at this time   Hypertension    not taking meds at this time   Nocturia    Pre-diabetes    Right hydrocele     Past Surgical History:  Procedure Laterality Date   HYDROCELE EXCISION Right 12/05/2015   Procedure: RIGHT HYDROCELECTOMY ADULT;  Surgeon: Osborn Blaze, MD;  Location: [facility];  Service: Urology;  Laterality: Right;   ORCHIOPEXY Right 12/05/2015   Procedure: ORCHIOPEXY/ SPERMATOCELECTOMY  ADULT;  Surgeon: Osborn Blaze, MD;  Location: [facility];  Service: Urology;  Laterality: Right;   TONSILLECTOMY     WISDOM TOOTH EXTRACTION      Family History  Problem Relation Age of Onset   Stroke Mother    Diabetes Mother    Heart disease Mother        Valvular Cardiomyopathy   Pulmonary embolism Father        complication of amputation of  leg for gangrene   Peripheral vascular disease Father    Alcohol abuse Father    Cancer Sister 35       colon:  2023 in remission   Bipolar disorder Sister    Cirrhosis Brother        alcoholic--had shunt   Lung cancer Brother        had removal of spot from lung   Alcohol abuse Brother        after death of son   Drug abuse Brother        Following death of son   Kidney disease Brother        not clear of cause:  exposure to something while in service in [place].  Peritoneal dialysis.  developed skin infection around catheter and died   Social History:  reports that he has been smoking cigarettes. He started smoking about 63 years ago. He has a 31.7 pack-year smoking history. He has never used smokeless tobacco. He reports current alcohol use. He reports current drug use. Drug: Cocaine.  Allergies:  Allergies  Allergen Reactions   Lisinopril  Swelling    Complaint of tongue swelling never observed by medical personnel.  Switching to a different bp med with angioedema as possibility    Medications Prior to Admission  Medication Sig Dispense Refill   empagliflozin  (JARDIANCE ) 10 MG TABS tablet Take 1 tablet (10 mg total) by mouth daily before breakfast. 30 tablet 11   metoprolol  succinate (TOPROL -XL) 25 MG 24 hr tablet 1 tab by mouth daily with 50 mg Metoprolol  30 tablet 11   rosuvastatin  (CRESTOR ) 10 MG tablet 1 tab by mouth daily with meal 90 tablet 3   triamcinolone  cream (KENALOG ) 0.1 % Apply 1 Application topically 2 (two) times daily. (Patient taking differently: Apply 1 Application topically 2 (two) times daily as needed (rash).) 30 g 2   trolamine salicylate (ASPERCREME) 10 % cream Apply 1 Application topically as needed for muscle pain.     furosemide  (LASIX ) 20 MG tablet Take 1 tablet (20 mg total) by mouth daily. 30 tablet 3   metoprolol  succinate (TOPROL -XL) 50 MG 24 hr tablet 1 tab by mouth with 25 mg Metoprolol  Succinate once daily with meal 90 tablet 3   naloxone   (NARCAN ) 0.4 MG/ML injection Take if you or anyone around you overdoses (Patient not taking: Reported on 04/12/2024) 1 mL 3   tamsulosin  (FLOMAX ) 0.4 MG CAPS capsule Take 1 capsule (0.4 mg total) by mouth daily after breakfast. 30 capsule 0    Results for orders placed or performed during the hospital encounter of 05/25/24 (from the past 48 hours)  Glucose, capillary     Status: None   Collection Time: 05/25/24  5:57 AM  Result Value Ref Range   Glucose-Capillary 83 70 - 99 mg/dL    Comment: Glucose reference range applies only to samples taken after fasting for at least 8 hours.   No results found.  Review of Systems  Constitutional:  Negative for chills and fever.  Genitourinary:  Positive for scrotal swelling.  All other systems reviewed and are negative.   Blood pressure 123/68, pulse 66, temperature 98.3 F (36.8 C), temperature source Oral, resp. rate 16, height 5\' 8"  (1.727 m), weight 75.8 kg, SpO2 96%. Physical Exam Vitals reviewed.  HENT:     Head: Normocephalic.  Eyes:     Pupils: Pupils are equal, round, and reactive to light.  Cardiovascular:     Rate and Rhythm: Normal rate.  Pulmonary:     Effort: Pulmonary effort is normal.  Abdominal:     General: Abdomen is flat.  Genitourinary:    Comments: STable Rt hydrocele / spermatocele and scrotal redundancy Musculoskeletal:        General: Normal range of motion.     Cervical back: Normal range of motion.  Skin:    General: Skin is warm.  Neurological:     General: No focal deficit present.     Mental Status: He is alert.  Psychiatric:        Mood and Affect: Mood normal.      Assessment/Plan  Proceed as planned with RIGHT hydrocelecomy / scrotoplasty for recurrent hydrocele and large redundant skin. Risks, benefits, alternatives, expected peri-op course discussed previously and reiterated today.   Melody Spurling., MD 05/25/2024, 6:41 AM

## 2024-05-25 NOTE — Discharge Instructions (Signed)
 1 - You will have pink / bloody discharge from area of drain that will slowly resolve over about a week. OK to shower anytime. No straddle activity (bicycle, horse riding) or active sexual stimulation x 2 weeks.   2 - Call MD or go to ER for fever >102, severe pain / nausea / vomiting not relieved by medications, or acute change in medical status

## 2024-05-25 NOTE — Anesthesia Procedure Notes (Signed)
 Procedure Name: LMA Insertion Date/Time: 05/25/2024 7:27 AM  Performed by: Inez Manger, CRNAPre-anesthesia Checklist: Emergency Drugs available, Patient identified, Suction available, Patient being monitored and Timeout performed Patient Re-evaluated:Patient Re-evaluated prior to induction Oxygen Delivery Method: Circle system utilized Preoxygenation: Pre-oxygenation with 100% oxygen Induction Type: IV induction Ventilation: Mask ventilation without difficulty LMA: LMA inserted LMA Size: 4.0 Tube type: Oral Number of attempts: 1 Placement Confirmation: ETT inserted through vocal cords under direct vision, positive ETCO2 and breath sounds checked- equal and bilateral ETT to lip (cm): yes. Tube secured with: Tape Dental Injury: Teeth and Oropharynx as per pre-operative assessment  Comments: Smooth brief atraumatic dentition unchanged

## 2024-05-25 NOTE — Anesthesia Postprocedure Evaluation (Signed)
 Anesthesia Post Note  Patient: Jeffrey Stafford  Procedure(s) Performed: HYDROCELECTOMY (Right) ORCHIOPEXY ADULT     Patient location during evaluation: PACU Anesthesia Type: General Level of consciousness: awake Pain management: pain level controlled Vital Signs Assessment: post-procedure vital signs reviewed and stable Respiratory status: spontaneous breathing, nonlabored ventilation and respiratory function stable Cardiovascular status: blood pressure returned to baseline and stable Postop Assessment: no apparent nausea or vomiting Anesthetic complications: no   No notable events documented.  Last Vitals:  Vitals:   05/25/24 0915 05/25/24 0930  BP: 111/60 (!) 114/57  Pulse: 65 65  Resp: 15 10  Temp:    SpO2: 93% 93%    Last Pain:  Vitals:   05/25/24 0930  TempSrc:   PainSc: 0-No pain                 Conard Decent

## 2024-05-25 NOTE — Brief Op Note (Signed)
 05/25/2024  8:12 AM  PATIENT:  Jeffrey Stafford  73 y.o. male  PRE-OPERATIVE DIAGNOSIS:  RIGHT HYDROCELE AND SPERMATOCELE  POST-OPERATIVE DIAGNOSIS:  RIGHT HYDROCELE AND SPERMATOCELE  PROCEDURE:  Procedure(s): HYDROCELECTOMY (Right) ORCHIOPEXY ADULT  SURGEON:  Surgeons and Role:    * Manny, Harvey Linen., MD - Primary  PHYSICIAN ASSISTANT:   ASSISTANTS: none   ANESTHESIA:   local and general  EBL:  1 mL   BLOOD ADMINISTERED:none  DRAINS: Penrose drain in the Rt dependant scrotum   LOCAL MEDICATIONS USED:  MARCAINE      SPECIMEN:  Source of Specimen:  reduncant scrotal skin  DISPOSITION OF SPECIMEN:  discard  COUNTS:  YES  TOURNIQUET:  * No tourniquets in log *  DICTATION: .Other Dictation: Dictation Number [id number]  PLAN OF CARE: Discharge to home after PACU  PATIENT DISPOSITION:  PACU - hemodynamically stable.   Delay start of Pharmacological VTE agent (>24hrs) due to surgical blood loss or risk of bleeding: yes

## 2024-05-25 NOTE — Transfer of Care (Signed)
 Immediate Anesthesia Transfer of Care Note  Patient: Jeffrey Stafford  Procedure(s) Performed: HYDROCELECTOMY (Right) ORCHIOPEXY ADULT  Patient Location: PACU  Anesthesia Type:General  Level of Consciousness: drowsy  Airway & Oxygen Therapy: Patient Spontanous Breathing and Patient connected to face mask oxygen  Post-op Assessment: Report given to RN and Post -op Vital signs reviewed and stable  Post vital signs: Reviewed and stable  Last Vitals:  Vitals Value Taken Time  BP 107/53 05/25/24 0822  Temp 35.7 C 05/25/24 0822  Pulse 66 05/25/24 0826  Resp 8 05/25/24 0826  SpO2 100 % 05/25/24 0826  Vitals shown include unfiled device data.  Last Pain:  Vitals:   05/25/24 0822  TempSrc:   PainSc: Asleep         Complications: No notable events documented.

## 2024-05-26 ENCOUNTER — Encounter (HOSPITAL_COMMUNITY): Payer: Self-pay | Admitting: Urology

## 2024-06-06 LAB — OPHTHALMOLOGY REPORT-SCANNED

## 2024-07-03 ENCOUNTER — Other Ambulatory Visit: Admitting: Psychology

## 2024-07-10 ENCOUNTER — Other Ambulatory Visit: Admitting: Psychology

## 2024-07-20 ENCOUNTER — Other Ambulatory Visit: Payer: Self-pay | Admitting: Internal Medicine

## 2024-08-16 ENCOUNTER — Ambulatory Visit (INDEPENDENT_AMBULATORY_CARE_PROVIDER_SITE_OTHER): Admitting: Internal Medicine

## 2024-08-16 ENCOUNTER — Telehealth: Payer: Self-pay | Admitting: Internal Medicine

## 2024-08-16 ENCOUNTER — Encounter: Payer: Self-pay | Admitting: Internal Medicine

## 2024-08-16 VITALS — BP 110/60 | HR 70 | Resp 18 | Ht 67.0 in | Wt 157.0 lb

## 2024-08-16 DIAGNOSIS — R600 Localized edema: Secondary | ICD-10-CM

## 2024-08-16 DIAGNOSIS — R195 Other fecal abnormalities: Secondary | ICD-10-CM | POA: Diagnosis not present

## 2024-08-16 DIAGNOSIS — D649 Anemia, unspecified: Secondary | ICD-10-CM

## 2024-08-16 DIAGNOSIS — I10 Essential (primary) hypertension: Secondary | ICD-10-CM

## 2024-08-16 DIAGNOSIS — R0989 Other specified symptoms and signs involving the circulatory and respiratory systems: Secondary | ICD-10-CM

## 2024-08-16 MED ORDER — FUROSEMIDE 20 MG PO TABS: ORAL_TABLET | ORAL | 11 refills | Status: DC

## 2024-08-16 NOTE — Telephone Encounter (Signed)
 Patient has been scheduled

## 2024-08-16 NOTE — Telephone Encounter (Signed)
 Patient has been seen.

## 2024-08-16 NOTE — Patient Instructions (Addendum)
 Call Dr. Milford office for tomorrow for flashes in left eye.

## 2024-08-16 NOTE — Progress Notes (Cosign Needed Addendum)
 Addendum: Rbc was noted on review of previous labs on a UA.   I discussed it with Dr. Adella on 08/23/24.SABRA  She reviewed the lab:   She said she noted it was a lab the hospital had drawn and it was due to traumatic foley placement; it does not need further action by us .   I had inadevertently put it under A/P of 08/16/24.  I meant to put an addendum to delete it but I deleted it from the A/P.  Subjective:    Patient ID: Jeffrey Stafford, male    DOB: [birth year], 73 y.o.   MRN: [id number]  H/o kidney disease, no known CAD/CHF, no known liver disease, presents with 10 days of unchanging BLE with mild discomfort.  Has not noticed any exacerbating or ameliorating factors.  Feels better when walking and sleeping and sitting.   No trauma.   Says it was there about about a month or 2  ago. His BP meds was increased and that took care of it. He has been taking statins, metoprolol  75mg  every day, lasix  20mg  every day.   For the past 20 days , he ran out of 25mg  tabs of metoprolol , and the statin. Compliant with the rest of meds. No chest pain, no fever, no change in baseline breathing. H/o tobacco and asbestosis.    No fevers.  Eats salty foods from his mom's cooking for past month. Denies recent drugs since the episode when he got into tainted cocaine.  Does not elevate feet all day. Sits or stands most of the time.   Also reports 20 pound weight loss over 2 month period.  Says he has not been wearing his dentures for the past year and has not been eating as much as he usually does because his mom cooks regular food.   Wt about 171 lb 4 months ago and is 157 lbs today according to clinic records; about 14 lb weight loss.   + FIT in the past and non-complaint with colonscopy.  + hematuria in the past. Feels week,         Review of Systems  Constitutional:  Positive for unexpected weight change. Negative for appetite change, chills and fever.       15-20 pound weight loss over 2 months that he attributes to  having to eat with dentures  HENT:  Positive for dental problem. Negative for congestion, drooling, ear discharge, ear pain, facial swelling and hearing loss.   Eyes:  Positive for photophobia. Negative for pain, discharge, redness and visual disturbance.       S/p cataract surgery in the left eye; has been having flashes of light, photophobia but no pain for several days.SABRA  Next appointment sept 2, 2024 No pain or headache. Wears dark glasses. Occurs 7 times a day.  Respiratory:  Negative for apnea, cough, choking, chest tightness, shortness of breath, wheezing and stridor.   Cardiovascular:  Positive for leg swelling. Negative for chest pain.  Gastrointestinal:  Negative for abdominal distention, abdominal pain, nausea and vomiting.  Genitourinary:        Difficulty starting urine, chronic, but once it starts, he has no difficulty.  Neurological:  Negative for numbness.  All other systems reviewed and are negative.      Objective:   Physical Exam Vitals and nursing note reviewed.  Constitutional:      General: He is not in acute distress.    Appearance: Normal appearance. He is not ill-appearing, toxic-appearing or diaphoretic.  Comments: Chronically ill appearing, thin, B temple waisting  HENT:     Head: Normocephalic and atraumatic.     Nose: Nose normal.     Mouth/Throat:     Comments: Edentulous. OP clear Eyes:     General:        Right eye: No discharge.        Left eye: No discharge.     Extraocular Movements: Extraocular movements intact.     Conjunctiva/sclera: Conjunctivae normal.     Pupils: Pupils are equal, round, and reactive to light.     Comments: No photophobia or tearing   Cardiovascular:     Rate and Rhythm: Normal rate.     Pulses: Normal pulses.  Pulmonary:     Breath sounds: Rales present.     Comments: Rales left lung base per Dr. Adella.  Rhonchi and inspiratory stridor B lungs per me. No wheeze. Can speak in full sentences.  Abdominal:      General: Abdomen is flat. There is no distension.     Palpations: Abdomen is soft. There is no mass.     Tenderness: There is no abdominal tenderness. There is no guarding or rebound.     Hernia: No hernia is present.  Musculoskeletal:        General: Swelling present.     Right lower leg: No edema.     Left lower leg: Edema present.     Comments: 2+ pitting edem BLE to the level of the knees. Non tender  Skin:    General: Skin is warm.     Capillary Refill: Feet and toes warm. Difficult to appreciate the pulses due to the edema of feet .    Coloration: Skin is not jaundiced.     Findings: No bruising, erythema, lesion or rash.  Neurological:     General: No focal deficit present.     Mental Status: He is alert and oriented to person, place, and time.     Motor: No weakness.     Coordination: Coordination normal.     Gait: Gait normal.  Psychiatric:        Mood and Affect: Mood normal.        Behavior: Behavior normal.        Thought Content: Thought content normal.        Judgment: Judgment normal.           Assessment & Plan:  1- BLE -acute/subacute- etiology unclear-  Did a work up.  Cardiac vs, kidney, vs liver. - EKG to rule out atrial fib was negative. NSR with non-specific changes interpreted by me.  No ST elev. Dr. Adella also reviewed the EKG. - cbc, lytes, lft's, BNP, trop I, hep B.  - 2-D echo heart - increase lasix  to 40mg  per day and f/u in 1 week for repeat labs and re-eval - GFR had dropped a little last labs. Will repeat.  2- left eye flashes of light and photophobia- 1 week or so- He was asked to call his ophthalmologist tomorrow and be seen tomorrow. Pt said ok  3- acute weight loss- not eating well due to poor fitting dentures for a year, Pos FIT and high colon Ca risk, smoker- CXR,  pt finally agreed to colonscopy.  Dr.Mulberry to make appt for coloscopy.  Asked to make appt with dental to get new dentures and to meanwhile liquify his foods. Pt  agreed.  Had a low TSH before and normal T4.   Repeat TSH/T4  4- flashing lights left eye- pt agreed to call optho tomorrow and be seen  Blood could not be drawn. Pt to come tomorrow at 11am for blood draw.

## 2024-08-16 NOTE — Telephone Encounter (Signed)
 Patient called and left a voicemail stating that he needs an appointment to see doctor.  Patient states his feet are swelling . Patient states he also needs refills for two prescriptions.  Called patient to offer an appointment, patient did not answer 08/16/24.

## 2024-08-16 NOTE — Progress Notes (Deleted)
 Needs rovustatin and metoprolol  25mg  qd

## 2024-08-17 ENCOUNTER — Encounter (HOSPITAL_COMMUNITY): Payer: Self-pay | Admitting: Certified Registered Nurse Anesthetist

## 2024-08-17 ENCOUNTER — Other Ambulatory Visit

## 2024-08-17 ENCOUNTER — Ambulatory Visit
Admission: RE | Admit: 2024-08-17 | Discharge: 2024-08-17 | Disposition: A | Discharge status: Home / Self Care | Source: Ambulatory Visit | Attending: Internal Medicine | Admitting: Internal Medicine

## 2024-08-17 DIAGNOSIS — R600 Localized edema: Secondary | ICD-10-CM

## 2024-08-17 DIAGNOSIS — R0989 Other specified symptoms and signs involving the circulatory and respiratory systems: Secondary | ICD-10-CM

## 2024-08-18 ENCOUNTER — Inpatient Hospital Stay: Admit: 2024-08-18 | Admitting: Ophthalmology

## 2024-08-18 SURGERY — REPAIR, RETINAL DETACHMENT, COMPLEX
Anesthesia: General | Laterality: Left

## 2024-08-19 ENCOUNTER — Encounter: Payer: Self-pay | Admitting: Internal Medicine

## 2024-08-19 ENCOUNTER — Ambulatory Visit: Payer: Self-pay | Admitting: Internal Medicine

## 2024-08-19 NOTE — Progress Notes (Unsigned)
 Agree with history and work up. Was not having problems after right hydrocelectomy and orchiopexy in May.    Sister with history of colon cancer and patient with history of anemia and +FIT in 2023.  Have not been able to get him to complete a GI evaluation.  Gives different reasons why he does not complete.   Has had difficulties with dentures for some time, which affects eating, but concerned for weight loss since 10/24, though weight has gone up and down since 09/2023, not just a steady decline.  HEENT;  wasting of musculature at temples seems more pronounced.   Neck:  No JVD Lungs:  Right basilar crackles on lung exam clear with deep cough, but left basilar crackles do not. CV:  RRR without murmur or rub.  Radial and DP pulses normal and equal Abd:  S, NT, No HSM or mass, + BS LE:  pitting edema to knees.    ECG NSR with baseline artifact, but no acute changes.    Evaluate for cardiac injury, failure (does not seem likely with today's exam) and causes of increased peripheral edema. Avoid salt and recline and elevate legs Double Furosemide . Hold on return to Metoprolol  ER 75 with increase in Furosemide  and good blood pressure today. May need cardiology referral as well  Referral again to GI--promises to follow through now he has had surgery for hydrocele, as promised at last visit.

## 2024-08-20 ENCOUNTER — Other Ambulatory Visit (INDEPENDENT_AMBULATORY_CARE_PROVIDER_SITE_OTHER): Admitting: Internal Medicine

## 2024-08-20 DIAGNOSIS — R7989 Other specified abnormal findings of blood chemistry: Secondary | ICD-10-CM

## 2024-08-20 NOTE — Progress Notes (Signed)
 Elevated Troponin T--here to check level again.

## 2024-08-21 ENCOUNTER — Telehealth: Payer: Self-pay | Admitting: Internal Medicine

## 2024-08-21 LAB — CBC WITH DIFFERENTIAL/PLATELET
Basophils Absolute: 0.1 x10E3/uL (ref 0.0–0.2)
Basos: 1 %
EOS (ABSOLUTE): 0.5 x10E3/uL — ABNORMAL HIGH (ref 0.0–0.4)
Eos: 8 %
Hematocrit: 35.3 % — ABNORMAL LOW (ref 37.5–51.0)
Hemoglobin: 11.2 g/dL — ABNORMAL LOW (ref 13.0–17.7)
Immature Grans (Abs): 0 x10E3/uL (ref 0.0–0.1)
Immature Granulocytes: 0 %
Lymphocytes Absolute: 1.9 x10E3/uL (ref 0.7–3.1)
Lymphs: 27 %
MCH: 29.1 pg (ref 26.6–33.0)
MCHC: 31.7 g/dL (ref 31.5–35.7)
MCV: 92 fL (ref 79–97)
Monocytes Absolute: 0.6 x10E3/uL (ref 0.1–0.9)
Monocytes: 9 %
Neutrophils Absolute: 3.9 x10E3/uL (ref 1.4–7.0)
Neutrophils: 55 %
Platelets: 251 x10E3/uL (ref 150–450)
RBC: 3.85 x10E6/uL — ABNORMAL LOW (ref 4.14–5.80)
RDW: 13.2 % (ref 11.6–15.4)
WBC: 7.1 x10E3/uL (ref 3.4–10.8)

## 2024-08-21 LAB — COMPREHENSIVE METABOLIC PANEL WITH GFR
ALT: 9 IU/L (ref 0–44)
AST: 19 IU/L (ref 0–40)
Albumin: 4.1 g/dL (ref 3.8–4.8)
Alkaline Phosphatase: 114 IU/L (ref 44–121)
BUN/Creatinine Ratio: 13 (ref 10–24)
BUN: 18 mg/dL (ref 8–27)
Bilirubin Total: 0.4 mg/dL (ref 0.0–1.2)
CO2: 25 mmol/L (ref 20–29)
Calcium: 9 mg/dL (ref 8.6–10.2)
Chloride: 102 mmol/L (ref 96–106)
Creatinine, Ser: 1.37 mg/dL — ABNORMAL HIGH (ref 0.76–1.27)
Globulin, Total: 2.5 g/dL (ref 1.5–4.5)
Glucose: 86 mg/dL (ref 70–99)
Potassium: 4.8 mmol/L (ref 3.5–5.2)
Sodium: 140 mmol/L (ref 134–144)
Total Protein: 6.6 g/dL (ref 6.0–8.5)
eGFR: 54 mL/min/1.73 — ABNORMAL LOW (ref >59)

## 2024-08-21 LAB — HEPATITIS B SURFACE ANTIBODY,QUALITATIVE: Hep B Surface Ab, Qual: NONREACTIVE

## 2024-08-21 LAB — HEPATITIS A ANTIBODY, TOTAL: hep A Total Ab: POSITIVE — AB

## 2024-08-21 LAB — HEPATITIS B SURFACE ANTIGEN: Hepatitis B Surface Ag: NEGATIVE

## 2024-08-21 LAB — TROPONIN T
Troponin T (Highly Sensitive): 28 ng/L — AB (ref 0–22)
Troponin T (Highly Sensitive): 24 ng/L (ref 0–22)

## 2024-08-21 LAB — TSH: TSH: 1.05 u[IU]/mL (ref 0.450–4.500)

## 2024-08-21 LAB — BRAIN NATRIURETIC PEPTIDE: BNP: 138.1 pg/mL — AB (ref 0.0–100.0)

## 2024-08-21 LAB — HEPATITIS B CORE ANTIBODY, TOTAL: Hep B Core Total Ab: NEGATIVE

## 2024-08-21 LAB — T4: T4, Total: 7 ug/dL (ref 4.5–12.0)

## 2024-08-21 NOTE — Telephone Encounter (Signed)
 Jeffrey Stafford from [facility] called and states that they need prior authorization from his insurance prior to scheduling appointment for Echocardiogram order.  Call back number: [phone]

## 2024-08-23 ENCOUNTER — Ambulatory Visit (INDEPENDENT_AMBULATORY_CARE_PROVIDER_SITE_OTHER): Admitting: Internal Medicine

## 2024-08-23 ENCOUNTER — Other Ambulatory Visit: Payer: Self-pay

## 2024-08-23 VITALS — BP 121/58 | HR 60 | Resp 17 | Ht 67.0 in | Wt 157.0 lb

## 2024-08-23 DIAGNOSIS — Z87891 Personal history of nicotine dependence: Secondary | ICD-10-CM | POA: Diagnosis not present

## 2024-08-23 DIAGNOSIS — H3322 Serous retinal detachment, left eye: Secondary | ICD-10-CM

## 2024-08-23 DIAGNOSIS — R600 Localized edema: Secondary | ICD-10-CM | POA: Diagnosis not present

## 2024-08-23 DIAGNOSIS — Z716 Tobacco abuse counseling: Secondary | ICD-10-CM

## 2024-08-23 DIAGNOSIS — H538 Other visual disturbances: Secondary | ICD-10-CM | POA: Insufficient documentation

## 2024-08-23 DIAGNOSIS — R7989 Other specified abnormal findings of blood chemistry: Secondary | ICD-10-CM

## 2024-08-23 DIAGNOSIS — F17200 Nicotine dependence, unspecified, uncomplicated: Secondary | ICD-10-CM

## 2024-08-23 DIAGNOSIS — R195 Other fecal abnormalities: Secondary | ICD-10-CM

## 2024-08-23 MED ORDER — ASPIRIN 81 MG PO TBEC: 81.0000 mg | DELAYED_RELEASE_TABLET | Freq: Every day | ORAL | Status: AC

## 2024-08-23 NOTE — Progress Notes (Cosign Needed Addendum)
 Subjective:  Initial H/P by me, doc in training under Dr. Adella. Dr. Adella then did her own H/P w/ me in the room and reviewed labs.   She formulated final A/P and gave discharge instructions to patient, written and verbal. This is a summary of both evals.   Patient ID: Jeffrey Stafford, male    DOB: 1951/01/20, 73 y.o.   MRN: [id number]  Seen 08/16/24, ( 8 days ago) for 3 main problems which were 1- about 10 day of BLE pitting edema that was never worked up and some lung base crackles but no resp distress and resting pulse ox of 96 percent with risk factors for CAD/CHF being pre-diabetes, cocaine use history, tobacco use, age of 78, male, HTN, h/o high cholesterol. 2- flashing lights in the left eye with h/o left eye cataract surgery . 3- +FIT and wt loss and h/o not being very interested in getting colonscopy Here today for follow-up of the above. Since last vist: 1- acute BLE edema: pt was found to be stable for outpatient management and work up during the last visit. Lasix  was increased to 40mg  per day from 20 mg /day.  He has has been taking the 40mg  and says there is decrease in his BLE edema.  Continues to report no SOB/CP. EKG no acute st/t changes or arrhythmia/ bradiacarida/tachycardia 08/16/24.SABRA  His troponins were 28 and 24, mild elevated but fairly stable. BNP 138 with mild elev,  CXR has not officially been read yet but no obvious masses or infiltrates or significant edema by my and Dr. Felix read.   2D echo was ordered but pt has not yet been called for it.   Smokes still 1/2 PPD .  Denies cocaine use now.   2- the left eye flashign lights : Saw his opthomologist who diagnosed him with retinal detachment. He had surgery for it.  Sees OK.  Continues to have flashing lights in the left eye but sees at baseline. Further he says he feels like something is standing over his left shoulder. No pain in the eye.    Scheduled to see retina specialist tomorrow.     - Also reports concern  that he is does not feel comfortable getting his right eye cataract surgery from his current opthomologist which is scheduled in the coming weak.  3- +FIT /wt loss- colonoscopy referral was placed.  Pt awaiting scheduling.    Past Medical History:  Diagnosis Date   Allergy    seasonal allergies   Anemia    hx of   Anxiety    hx of   Arthritis    generalized   Cocaine substance abuse (HCC) 2014-2015   Still using in 2025 -new info after accidental OD with cocaine laced with opiate   Depression    hx of   Diabetes type 2, controlled (HCC) 2022   Eczema    GERD (gastroesophageal reflux disease)    with certain foods   Hyperlipidemia    not taking meds at this time   Hypertension    not taking meds at this time   Nocturia    Right hydrocele        Review of Systems  Constitutional:  Positive for unexpected weight change. Negative for activity change and appetite change.       Already addressed. May be from not using dentures vs other causes.   R/o colon ca with colonscopy in progress and screening chest CT in progress to be addressed next  month. Wt change not a lot but there is temple wasting and pt has ca risk factors.  HENT:  Positive for dental problem. Negative for congestion.   Eyes:  Positive for visual disturbance. Negative for pain, discharge, redness and itching.       B eye cataracts. See HPI. Next visit with retina sepcialist tomorrow, catract surgeon next week.   Currently stable  Respiratory:  Negative for apnea, chest tightness, shortness of breath and stridor.   Cardiovascular:  Positive for leg swelling. Negative for chest pain.  Gastrointestinal:  Negative for abdominal distention.       +FIT unhandled. Colonscopy pending  Psychiatric/Behavioral:  Positive for agitation.        Slight agitation due to eye issues.    Current Meds  Medication Sig   aspirin  EC 81 MG tablet Take 1 tablet (81 mg total) by mouth daily. Swallow whole.   empagliflozin   (JARDIANCE ) 10 MG TABS tablet Take 1 tablet (10 mg total) by mouth daily before breakfast.   furosemide  (LASIX ) 20 MG tablet 2 tabs by mouth daily in morning   metoprolol  succinate (TOPROL -XL) 50 MG 24 hr tablet 1 tab by mouth with 25 mg Metoprolol  Succinate once daily with meal   rosuvastatin  (CRESTOR ) 10 MG tablet TAKE 1 TABLET BY MOUTH ONCE DAILY WITH MEALS   trolamine salicylate (ASPERCREME) 10 % cream Apply 1 Application topically as needed for muscle pain.    Allergies  Allergen Reactions   Lisinopril  Swelling    Complaint of tongue swelling never observed by medical personnel.  Switching to a different bp med with angioedema as possibility   SoHx- lives with mother who cooks an puts salt in the food. +tobacco.     Objective:   Vitals:   08/23/24 0900  BP: (!) 121/58  Pulse: 60  Resp: 17        Physical Exam Vitals and nursing note reviewed.  Constitutional:      General: He is not in acute distress.    Appearance: Normal appearance. He is not ill-appearing, toxic-appearing or diaphoretic.  HENT:     Head: Normocephalic and atraumatic.     Ears:     Comments: Grossly normal Cardiovascular:     Rate and Rhythm: Normal rate and regular rhythm.     Comments: Cannot easily palplate DP but feet warm with no ulcers.  Pulmonary:     Effort: Pulmonary effort is normal.     Breath sounds: Rhonchi and rales present.     Comments: Occasional rale. Much improved compared to last time. Abdominal:     General: There is no distension.     Palpations: Abdomen is soft.  Musculoskeletal:     Right lower leg: Edema present.     Left lower leg: Edema present.     Comments: To mid -calf, down 1+. Last week it was around 1- 2+ to the knees B.   Skin:    General: Skin is warm and dry.     Coloration: Skin is not jaundiced or pale.     Findings: No bruising.  Neurological:     General: No focal deficit present.     Mental Status: He is alert and oriented to person, place, and time.      Gait: Gait normal.  Psychiatric:        Mood and Affect: Mood normal.        Behavior: Behavior normal.        Thought Content: Thought content normal.  Judgment: Judgment normal.    Labs, CXR reviewed on EPIC from last week.   Orders reviewed from last week.  Health maintenance vaccines reviewed on EPIC.   Assessment & Plan:  1- Acute BLE workup- probable CHF/CAD with elevated trop's and BNP; may have had an MI in the past prior to sx's 10 days ago with high risk factors. - stable. F/u on 2D echo. Cardiology referral.  Cirrhosis and Kidney failure unlike as cause given history and work up. Continue lasix  40mg  every day and repeat CMP today to evaluate potassium. RN unable to get blood. RTC tomorrow for blood draw.  Pt counciled by Dr. Adella to stop smoking and to ask for patch when he is ready.  Encouraged to continue staying off cocaine.   ASA 81 mg QD started.  2- left eye retinal tear, s/p  surgical procedure- stable today. F/U tomorrow.  3- Concern about upcoming cataract surgery right eye next week- pt not sure about cataract surgeon.  Pt advised to discuss with [facility] opthopmologist for another surgeon.  Pt OK with that.  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   F/U per Dr. Adella pending work-up results.

## 2024-08-24 ENCOUNTER — Other Ambulatory Visit

## 2024-08-24 DIAGNOSIS — R600 Localized edema: Secondary | ICD-10-CM

## 2024-08-25 LAB — BASIC METABOLIC PANEL WITH GFR
BUN/Creatinine Ratio: 15 (ref 10–24)
BUN: 30 mg/dL — ABNORMAL HIGH (ref 8–27)
CO2: 24 mmol/L (ref 20–29)
Calcium: 9.2 mg/dL (ref 8.6–10.2)
Chloride: 100 mmol/L (ref 96–106)
Creatinine, Ser: 2 mg/dL — ABNORMAL HIGH (ref 0.76–1.27)
Glucose: 92 mg/dL (ref 70–99)
Potassium: 5.5 mmol/L — ABNORMAL HIGH (ref 3.5–5.2)
Sodium: 137 mmol/L (ref 134–144)
eGFR: 35 mL/min/1.73 — ABNORMAL LOW (ref >59)

## 2024-08-31 ENCOUNTER — Ambulatory Visit (HOSPITAL_COMMUNITY)
Admission: RE | Admit: 2024-08-31 | Discharge: 2024-08-31 | Disposition: A | Discharge status: Home / Self Care | Source: Ambulatory Visit | Attending: Internal Medicine | Admitting: Internal Medicine

## 2024-08-31 DIAGNOSIS — R0989 Other specified symptoms and signs involving the circulatory and respiratory systems: Secondary | ICD-10-CM | POA: Diagnosis not present

## 2024-08-31 DIAGNOSIS — R600 Localized edema: Secondary | ICD-10-CM | POA: Insufficient documentation

## 2024-08-31 LAB — ECHOCARDIOGRAM COMPLETE
Area-P 1/2: 2.36 cm2
S' Lateral: 3.2 cm

## 2024-08-31 NOTE — Progress Notes (Signed)
  Echocardiogram 2D Echocardiogram has been performed.  Jeffrey Stafford 08/31/2024, 4:42 PM

## 2024-09-24 ENCOUNTER — Ambulatory Visit
Admission: RE | Admit: 2024-09-24 | Discharge: 2024-09-24 | Disposition: A | Discharge status: Home / Self Care | Source: Ambulatory Visit | Attending: Internal Medicine | Admitting: Internal Medicine

## 2024-09-24 DIAGNOSIS — F17200 Nicotine dependence, unspecified, uncomplicated: Secondary | ICD-10-CM

## 2024-09-24 DIAGNOSIS — Z87891 Personal history of nicotine dependence: Secondary | ICD-10-CM

## 2024-10-12 ENCOUNTER — Other Ambulatory Visit (INDEPENDENT_AMBULATORY_CARE_PROVIDER_SITE_OTHER)

## 2024-10-12 DIAGNOSIS — E119 Type 2 diabetes mellitus without complications: Secondary | ICD-10-CM | POA: Diagnosis not present

## 2024-10-12 DIAGNOSIS — E782 Mixed hyperlipidemia: Secondary | ICD-10-CM | POA: Diagnosis not present

## 2024-10-13 LAB — BASIC METABOLIC PANEL WITH GFR
BUN/Creatinine Ratio: 14 (ref 10–24)
BUN: 25 mg/dL (ref 8–27)
CO2: 24 mmol/L (ref 20–29)
Calcium: 9.6 mg/dL (ref 8.6–10.2)
Chloride: 100 mmol/L (ref 96–106)
Creatinine, Ser: 1.8 mg/dL — ABNORMAL HIGH (ref 0.76–1.27)
Glucose: 89 mg/dL (ref 70–99)
Potassium: 4.3 mmol/L (ref 3.5–5.2)
Sodium: 139 mmol/L (ref 134–144)
eGFR: 39 mL/min/1.73 — ABNORMAL LOW (ref >59)

## 2024-10-13 LAB — HEMOGLOBIN A1C
Est. average glucose Bld gHb Est-mCnc: 131 mg/dL
Hgb A1c MFr Bld: 6.2 % — ABNORMAL HIGH (ref 4.8–5.6)

## 2024-10-13 LAB — LIPID PANEL
Chol/HDL Ratio: 2.6 ratio (ref 0.0–5.0)
Cholesterol, Total: 151 mg/dL (ref 100–199)
HDL: 58 mg/dL (ref >39)
LDL Chol Calc (NIH): 76 mg/dL (ref 0–99)
Triglycerides: 90 mg/dL (ref 0–149)
VLDL Cholesterol Cal: 17 mg/dL (ref 5–40)

## 2024-10-16 ENCOUNTER — Ambulatory Visit: Admitting: Internal Medicine

## 2024-10-16 ENCOUNTER — Encounter: Payer: Self-pay | Admitting: Internal Medicine

## 2024-10-16 VITALS — BP 122/62 | HR 60 | Resp 12 | Ht 67.0 in | Wt 160.0 lb

## 2024-10-16 DIAGNOSIS — R600 Localized edema: Secondary | ICD-10-CM | POA: Diagnosis not present

## 2024-10-16 DIAGNOSIS — H3322 Serous retinal detachment, left eye: Secondary | ICD-10-CM | POA: Diagnosis not present

## 2024-10-16 DIAGNOSIS — D649 Anemia, unspecified: Secondary | ICD-10-CM

## 2024-10-16 DIAGNOSIS — F17200 Nicotine dependence, unspecified, uncomplicated: Secondary | ICD-10-CM

## 2024-10-16 DIAGNOSIS — R195 Other fecal abnormalities: Secondary | ICD-10-CM | POA: Diagnosis not present

## 2024-10-16 DIAGNOSIS — I10 Essential (primary) hypertension: Secondary | ICD-10-CM

## 2024-10-16 NOTE — Patient Instructions (Signed)
Recommend nicotine patches:  21 mg to skin and change daily for 28 days, then down to 14 mg patches for 14 days, then down to 7 mg patches for 14 days, then stop. Get rid of all cigarettes, lighters, ash trays, smoking paraphernalia before start first patch

## 2024-10-16 NOTE — Progress Notes (Signed)
 Subjective:    Patient ID: Jeffrey Stafford, male   DOB: [birth year], 73 y.o.   MRN: [id number]   HPI   Peripheral edema:  He is having difficulty with his history today.  Did not bring in his meds and mixing Furosemide  with Metoprolol .  He also ran out of his furosemide  at one point.  Cannot recall if was before his feet swelled again last week.  The swelling is better again now.  He has not yet been seen by Cardiology.  To see them end of the month.  Echo did not show any regional wall motion abnormality and EF was 60-65%.  Did have elevation of Troponin T, though did not really seem to come down.  Also had lung cancer CT screen that did not show concerning lesions for cancer.  No chest pain and breathing okay.  2.  Hypertension:  Ultimately, seems he is still taking just 75 mg of Toprol  XL.    3.  Detached retina, left eye.  He can now see clearly after repair.  He is now dealing with cataract on right eye.    4.  + FIT repeatedly with anemia:  He denies avoiding having colonoscopy, but he has no idea of  GI has called to get him set up with EGD/colonoscopy.  I cannot see in his referral if they have attempted to connect with him.    5.  Prediabetes:  On Jardiance  for this and cardiac protection.  A1C improved, though still in prediabetic range at 6.2%  Knows he could do better with eating.  Cholesterol okay, though would like to see LDL below 70. Lipid Panel     Component Value Date/Time   CHOL 151 10/12/2024 0952   TRIG 90 10/12/2024 0952   HDL 58 10/12/2024 0952   CHOLHDL 2.6 10/12/2024 0952   LDLCALC 76 10/12/2024 0952   LABVLDL 17 10/12/2024 0952   Current Meds  Medication Sig   aspirin  EC 81 MG tablet Take 1 tablet (81 mg total) by mouth daily. Swallow whole.   empagliflozin  (JARDIANCE ) 10 MG TABS tablet Take 1 tablet (10 mg total) by mouth daily before breakfast.   furosemide  (LASIX ) 20 MG tablet Take 40 mg by mouth daily.   metoprolol  succinate (TOPROL -XL) 50 MG 24  hr tablet 1 tab by mouth with 25 mg Metoprolol  Succinate once daily with meal   rosuvastatin  (CRESTOR ) 10 MG tablet TAKE 1 TABLET BY MOUTH ONCE DAILY WITH MEALS   tamsulosin  (FLOMAX ) 0.4 MG CAPS capsule Take 1 capsule (0.4 mg total) by mouth daily after breakfast.   triamcinolone  cream (KENALOG ) 0.1 % Apply 1 Application topically 2 (two) times daily.     Allergies  Allergen Reactions   Lisinopril  Swelling    Complaint of tongue swelling never observed by medical personnel.  Switching to a different bp med with angioedema as possibility     Review of Systems  Respiratory:  Negative for shortness of breath (even when walking uphill).   Cardiovascular:  Negative for chest pain.      Objective:   BP 122/62 (BP Location: Left Arm, Patient Position: Sitting, Cuff Size: Normal)   Pulse 60   Resp 12   Ht 5' 7 (1.702 m)   Wt 160 lb (72.6 kg)   BMI 25.06 kg/m   Physical Exam Eyes:     Extraocular Movements: Extraocular movements intact.     Conjunctiva/sclera: Conjunctivae normal.     Pupils: Pupils are equal, round, and reactive  to light.  Neck:     Thyroid: No thyroid mass or thyromegaly.  Cardiovascular:     Rate and Rhythm: Normal rate and regular rhythm.     Heart sounds: S1 normal and S2 normal. No murmur heard.    No friction rub. No S3 or S4 sounds.     Comments: No JVD No carotid bruits.  Carotid, radial, femoral, DP and PT pulses normal and equal.   Pulmonary:     Effort: Pulmonary effort is normal.     Breath sounds: Normal breath sounds and air entry.  Abdominal:     General: Bowel sounds are normal.     Palpations: Abdomen is soft. There is no hepatomegaly, splenomegaly or mass.     Tenderness: There is no abdominal tenderness.     Hernia: No hernia is present.  Musculoskeletal:     Cervical back: Normal range of motion and neck supple.     Right lower leg: 1+ Pitting Edema (to pretib area) present.     Left lower leg: 1+ Pitting Edema (to pretib area)  present.     Assessment & Plan   Peripheral edema:  Patient brought in meds later and appears to be taking them appropriately.  He had them confused at the visit.  Still with mild edema bilaterally.    2.  Tobacco Use Disorder:  Send in nicotine  patches again.  Will see if follows through.    3.  Hypertension:  well controlled  4.  Positive FIT, anemia, and sister with history of colon cancer.  Has been resistant to getting in with GI.  Unable to ascertain why.  Continue to encourage to call GI and get reappointed.

## 2024-10-26 ENCOUNTER — Ambulatory Visit: Attending: Cardiology | Admitting: Cardiology

## 2024-10-26 VITALS — BP 133/70 | HR 70 | Ht 67.0 in | Wt 159.0 lb

## 2024-10-26 DIAGNOSIS — E782 Mixed hyperlipidemia: Secondary | ICD-10-CM | POA: Insufficient documentation

## 2024-10-26 DIAGNOSIS — N1832 Chronic kidney disease, stage 3b: Secondary | ICD-10-CM | POA: Diagnosis present

## 2024-10-26 DIAGNOSIS — I10 Essential (primary) hypertension: Secondary | ICD-10-CM

## 2024-10-26 DIAGNOSIS — R7303 Prediabetes: Secondary | ICD-10-CM | POA: Insufficient documentation

## 2024-10-26 DIAGNOSIS — F17200 Nicotine dependence, unspecified, uncomplicated: Secondary | ICD-10-CM | POA: Diagnosis present

## 2024-10-26 DIAGNOSIS — I2510 Atherosclerotic heart disease of native coronary artery without angina pectoris: Secondary | ICD-10-CM | POA: Insufficient documentation

## 2024-10-26 DIAGNOSIS — E119 Type 2 diabetes mellitus without complications: Secondary | ICD-10-CM | POA: Insufficient documentation

## 2024-10-26 DIAGNOSIS — R600 Localized edema: Secondary | ICD-10-CM | POA: Diagnosis present

## 2024-10-26 NOTE — Progress Notes (Signed)
 Cardiology Office Note:  .   Date:  10/28/2024  ID:  Jeffrey Stafford, DOB 07-11-1951, MRN [id number] PCP: Adella Norris, MD  [facility] Providers Cardiologist:  Alm Clay, MD     Chief Complaint  Patient presents with   New Patient (Initial Visit)    Lower extremity edema    Patient Profile: .     Jeffrey Stafford is a  73 y.o. male smoker (30-pack-year smoking history) with a PMH notable for recurrent substance abuse (cocaine, heroin alcohol and marijuana), ?  Borderline DM-2, hypertension, hyperlipidemia and edema who presents here for evaluation of Peripheral Edema/elevated BNP at the request of Adella Norris, MD. ([facility])     [name] was monitored overnight March 31, 2024 at most 1 hospital for heroin overdose-treated with Narcan .  He was seen by Dr. Adella on August 23, 2024 when he noted about a 10-day spell of bilateral lower extremity edema.  Apparently there was also some suggestion of lung basilar crackles pulse ox was okay. => Apparently swelling has been an issue in the past and he was increased from 20 mg to 40 mg Lasix .  The increased dose helped his edema.  During previous evaluation on August 21 the had troponins that were 28 and 24 that were stable.  BNP was 138.  No recent cocaine use.  2D echo was ordered and he was referred to cardiology  She is on again on October 21: Noted to be a poor historian.  Did not bring his medications.  Apparently he was mixing up his furosemide  and metoprolol  dosing.  He had run out of his furosemide .  He did note that his swelling was better.   He apparently was taking 25+50 mg Toprol  to total 475 mg.  He had been placed on Jardiance  for prediabetes with a follow-up creatinine of 6.2.  Was on Crestor  10 mg for the hyperlipidemia.  Subjective  Discussed the use of AI scribe software for clinical note transcription with the patient, who gave verbal consent to proceed.  History of  Present Illness SHO Jeffrey Stafford is a 73 year old male who presents with persistent lower extremity edema. He was referred by Dr. Adella for evaluation of his leg swelling.  Mr. Alverio is a very poor historian and does not provide excellent answers.  He has been experiencing swelling in his legs for approximately two weeks, which he attributes to running out of his furosemide  medication. He has been taking furosemide  for about three years, and the swelling improves with its use and when he elevates his legs. No orthopnea, paroxysmal nocturnal dyspnea, or exertional chest pain.  He reports that an echocardiogram was performed recently and that he was told his heart function was normal. His primary care physician was concerned about a potential heart attack due to elevated blood pressure, but he has not experienced any prolonged chest pain or pressure.  He smokes about half a pack of cigarettes per day and works as a curator, which involves standing for long periods. He uses support socks and leg compression devices, which help alleviate the swelling.  About a month or two ago, he overdosed on fentanyl  (report was heroin on ER visit, but he mention fentanyl ) and was taken to the hospital by ambulance. Since then, he has moved back in with his 42 year old mother.     Objective   Medications - Furosemide  (fluid pills) for 3 years: 20 mg tablets taking 2 tablets a day -  Metoprolol  succinate (Toprol -XL) 75 mg (25 mg +50 mg tablet daily) - Aspirin  81 mg daily - Jardiance  10 mg daily - Crestor  10 mg daily - Tamsulosin  0.4 mg daily  PSH: Right hydrocelectomy 2016;  Social History   Tobacco Use   Smoking status: Every Day    Current packs/day: 0.50    Average packs/day: 0.5 packs/day for 63.8 years (31.9 ttl pk-yrs)    Types: Cigarettes    Start date: 1962   Smokeless tobacco: Never   Tobacco comments: Started smoking 63 years ago.    Ordered patches today. 04/12/2023  Vaping Use    Vaping status: Never Used  Substance Use Topics   Alcohol use: Yes    Comment: every other day:  swig of liquor   Drug use: Yes    Types: Cocaine    Comment: Last use two weeks ago    Studies Reviewed: SABRA       Lab Results  Component Value Date   CHOL 151 10/12/2024   HDL 58 10/12/2024   LDLCALC 76 10/12/2024   TRIG 90 10/12/2024   CHOLHDL 2.6 10/12/2024   Lab Results  Component Value Date   NA 139 10/12/2024   K 4.3 10/12/2024   CREATININE 1.80 (H) 10/12/2024   EGFR 39 (L) 10/12/2024   GLUCOSE 89 10/12/2024   Lab Results  Component Value Date   HGBA1C 6.2 (H) 10/12/2024  Troponin ~24 & 28  (8/21 & 8/25)  Results RADIOLOGY CT Chest: Lung RADS 2.  Benign appearance.  Normal heart size.  Atherosclerotic calcification of coronary arteries.  No pericardial fluid.  Multiple foci of mucostasis and left lower lobe, right middle lobe.  Right juxtapleural pleural nodules less than 5 mm.  No suspicious nodule or honeycombing.  (09/28/2024).  DIAGNOSTIC Echocardiogram: Ejection fraction 60-65%, no wall motion abnormalities, normal right-sided pressures, normal mitral and aortic valve function (10/16/2024)   Risk Assessment/Calculations:           Physical Exam:   VS:  BP 133/70   Pulse 70   Ht 5' 7 (1.702 m)   Wt 159 lb (72.1 kg)   SpO2 96%   BMI 24.90 kg/m    Wt Readings from Last 3 Encounters:  10/26/24 159 lb (72.1 kg)  10/16/24 160 lb (72.6 kg)  08/23/24 157 lb (71.2 kg)      GEN: Well nourished, well groomed; in no acute distress; relatively healthy..  He smells of cigarette and would smoke. NECK: No JVD; No carotid bruits CARDIAC: Normal S1, S2; RRR, no murmurs, rubs, gallops RESPIRATORY:  Clear to auscultation without rales, wheezing or rhonchi ; nonlabored, good air movement. ABDOMEN: Soft, non-tender, non-distended EXTREMITIES:  No edema; No deformity      ASSESSMENT AND PLAN: .    Problem List Items Addressed This Visit       Cardiology Problems    Coronary artery calcification seen on CAT scan (Chronic)   Coronary CAT scan as noted on chest CT recently performed.  Not unexpected in a patient with cardiac risk factors.  At this point I do not think it is necessary for coronary calcium  scoring, since the CT scan clearly shows evidence of coronary calcium .  His echocardiogram is essentially normal.  Recommendations in the absence of active anginal symptoms would be respected modification with blood pressure, lipid and glycemic control as well as smoking cessation.  He has pretty well-controlled lipids with an LDL of 76.  Blood pressure is controlled.  A1c is 6.2.  His major ongoing risk factors with his substance abuse and cigarettes. Smoking cessation counseling provided. Would otherwise continue current meds.  Low threshold for ischemic evaluation worried to have symptoms of chest pain pressure or progressive dyspnea.      Mixed hyperlipidemia (Chronic)   Lipids actually pretty well-controlled on 10 mg rosuvastatin . LDL 76 on 10 mg rosuvastatin .  Continue current dose.      Primary hypertension (Chronic)   Interestingly, he has hypertension treated with Toprol  which is somewhat concerning for somebody who has a history of cocaine use. Low threshold to convert to carvedilol for alpha at beta-blockade However, will consider ARB for afterload reduction if there is some component of diastolic dysfunction. Amlodipine  would be a good option given the potential for visit to the dilatory effect in the setting of possible spasm from cocaine use, however with his edema issues, would be suboptimal.        Other   Chronic kidney disease (Chronic)   Creatinine 1.8.  Followed by PCP.      DM (diabetes mellitus), type 2 (HCC) (Chronic)   Management PCP.  Interestingly he is on Jardiance  but A1c was 6.2. Helpful for diuresis.      Peripheral edema - Primary   Localized leg edema without PND orthopnea.  Echocardiogram relatively normal  with no signs of significant elevated right-sided pressures or left-sided filling pressures. Suspected venous insufficiency due to prolonged standing and venous valve dysfunction.  - Order venous Doppler ultrasound to assess for venous insufficiency. - Advise wearing support stockings. - Recommend leg elevation when possible. - Encourage calf muscle exercises to promote venous return. Reasonable to continue diuresis.      Relevant Orders   VAS US  LOWER EXTREMITY VENOUS REFLUX   Prediabetes (Chronic)   Relevant Orders   VAS US  LOWER EXTREMITY VENOUS REFLUX   Tobacco use disorder (Chronic)   Smoking cessation instruction/counseling given:  counseled patient on the dangers of tobacco use, advised patient to stop smoking, and reviewed strategies to maximize success              Follow-Up: Return if symptoms worsen or fail to improve, for Followup when necessary.      Signed, Alm MICAEL Clay, MD, MS Alm Clay, M.D., M.S. Interventional Cardiologist  [facility] Pager # [phone]

## 2024-10-26 NOTE — Patient Instructions (Addendum)
 Medication Instructions:  No changes   *If you need a refill on your cardiac medications before your next appointment, please call your pharmacy*   Lab Work: Not needed If you have labs (blood work) drawn today and your tests are completely normal, you will receive your results only by: MyChart Message (if you have MyChart) OR A paper copy in the mail If you have any lab test that is abnormal or we need to change your treatment, we will call you to review the results.   Testing/Procedures: Your physician has requested that you have a lower extremity venous duplex-- reflux. This test is an ultrasound of the veins in the legs. It looks at venous blood flow that carries blood from the heart to the legs. Allow one hour for a Lower Venous exam.  There are no restrictions or special instructions.  Please note: We ask at that you not bring children with you during ultrasound (echo/ vascular) testing. Due to room size and safety concerns, children are not allowed in the ultrasound rooms during exams. Our front office staff cannot provide observation of children in our lobby area while testing is being conducted. An adult accompanying a patient to their appointment will only be allowed in the ultrasound room at the discretion of the ultrasound technician under special circumstances. We apologize for any inconvenience.    Follow-Up: At [facility], you and your health needs are our priority.  As part of our continuing mission to provide you with exceptional heart care, we have created designated Provider Care Teams.  These Care Teams include your primary Cardiologist (physician) and Advanced Practice Providers (APPs -  Physician Assistants and Nurse Practitioners) who all work together to provide you with the care you need, when you need it.     Your next appointment:   As needed   The format for your next appointment:   In Person  Provider:   Alm Clay, MD

## 2024-10-27 ENCOUNTER — Ambulatory Visit: Payer: Self-pay | Admitting: Internal Medicine

## 2024-10-28 ENCOUNTER — Encounter: Payer: Self-pay | Admitting: Cardiology

## 2024-10-28 DIAGNOSIS — I2510 Atherosclerotic heart disease of native coronary artery without angina pectoris: Secondary | ICD-10-CM | POA: Insufficient documentation

## 2024-10-28 NOTE — Assessment & Plan Note (Signed)
 Coronary CAT scan as noted on chest CT recently performed.  Not unexpected in a patient with cardiac risk factors.  At this point I do not think it is necessary for coronary calcium  scoring, since the CT scan clearly shows evidence of coronary calcium .  His echocardiogram is essentially normal.  Recommendations in the absence of active anginal symptoms would be respected modification with blood pressure, lipid and glycemic control as well as smoking cessation.  He has pretty well-controlled lipids with an LDL of 76.  Blood pressure is controlled.  A1c is 6.2.  His major ongoing risk factors with his substance abuse and cigarettes. Smoking cessation counseling provided. Would otherwise continue current meds.  Low threshold for ischemic evaluation worried to have symptoms of chest pain pressure or progressive dyspnea.

## 2024-10-28 NOTE — Assessment & Plan Note (Addendum)
 Management PCP.  Interestingly he is on Jardiance  but A1c was 6.2. Helpful for diuresis.

## 2024-10-28 NOTE — Assessment & Plan Note (Signed)
 Localized leg edema without PND orthopnea.  Echocardiogram relatively normal with no signs of significant elevated right-sided pressures or left-sided filling pressures. Suspected venous insufficiency due to prolonged standing and venous valve dysfunction.  - Order venous Doppler ultrasound to assess for venous insufficiency. - Advise wearing support stockings. - Recommend leg elevation when possible. - Encourage calf muscle exercises to promote venous return. Reasonable to continue diuresis.

## 2024-10-28 NOTE — Assessment & Plan Note (Signed)
 Creatinine 1.8.  Followed by PCP.

## 2024-10-28 NOTE — Assessment & Plan Note (Signed)
 Smoking cessation instruction/counseling given:  counseled patient on the dangers of tobacco use, advised patient to stop smoking, and reviewed strategies to maximize success

## 2024-10-28 NOTE — Assessment & Plan Note (Signed)
 Lipids actually pretty well-controlled on 10 mg rosuvastatin . LDL 76 on 10 mg rosuvastatin .  Continue current dose.

## 2024-10-28 NOTE — Assessment & Plan Note (Signed)
 Interestingly, he has hypertension treated with Toprol  which is somewhat concerning for somebody who has a history of cocaine use. Low threshold to convert to carvedilol for alpha at beta-blockade However, will consider ARB for afterload reduction if there is some component of diastolic dysfunction. Amlodipine  would be a good option given the potential for visit to the dilatory effect in the setting of possible spasm from cocaine use, however with his edema issues, would be suboptimal.

## 2024-11-02 ENCOUNTER — Ambulatory Visit (HOSPITAL_COMMUNITY)
Admission: RE | Admit: 2024-11-02 | Discharge: 2024-11-02 | Disposition: A | Discharge status: Home / Self Care | Source: Ambulatory Visit | Attending: Cardiology | Admitting: Cardiology

## 2024-11-02 DIAGNOSIS — R7303 Prediabetes: Secondary | ICD-10-CM | POA: Diagnosis present

## 2024-11-02 DIAGNOSIS — R600 Localized edema: Secondary | ICD-10-CM | POA: Insufficient documentation

## 2024-11-14 ENCOUNTER — Ambulatory Visit: Payer: Self-pay | Admitting: Cardiology

## 2024-11-15 ENCOUNTER — Encounter: Payer: Self-pay | Admitting: Internal Medicine

## 2024-11-15 ENCOUNTER — Ambulatory Visit: Admitting: Internal Medicine

## 2024-11-15 VITALS — BP 118/60 | HR 58 | Resp 12 | Ht 67.5 in | Wt 158.0 lb

## 2024-11-15 DIAGNOSIS — F17200 Nicotine dependence, unspecified, uncomplicated: Secondary | ICD-10-CM | POA: Diagnosis not present

## 2024-11-15 DIAGNOSIS — D649 Anemia, unspecified: Secondary | ICD-10-CM

## 2024-11-15 DIAGNOSIS — E119 Type 2 diabetes mellitus without complications: Secondary | ICD-10-CM

## 2024-11-15 DIAGNOSIS — I10 Essential (primary) hypertension: Secondary | ICD-10-CM

## 2024-11-15 DIAGNOSIS — R600 Localized edema: Secondary | ICD-10-CM | POA: Diagnosis not present

## 2024-11-15 DIAGNOSIS — N189 Chronic kidney disease, unspecified: Secondary | ICD-10-CM | POA: Diagnosis not present

## 2024-11-15 DIAGNOSIS — R195 Other fecal abnormalities: Secondary | ICD-10-CM | POA: Diagnosis not present

## 2024-11-15 DIAGNOSIS — Z716 Tobacco abuse counseling: Secondary | ICD-10-CM | POA: Diagnosis not present

## 2024-11-15 MED ORDER — CARVEDILOL 6.25 MG PO TABS
6.2500 mg | ORAL_TABLET | Freq: Two times a day (BID) | ORAL | 11 refills | Status: AC
Start: 2024-11-15 — End: ?

## 2024-11-15 NOTE — Progress Notes (Signed)
"   DM:  Patient carries diagnosis of DM 2 from 2022.  Inadvertently listed as prediabetes last visit.  Continues on Jardiance  for DM, CKD, cardiac protection.  2.  Anemia, + FIT and sister with hx of colon cancer:  have tried to get patient in for colonoscopy since 2023, but he remains resistant.  Not clear if he is afraid something will be found, but has been sent to GI and did not follow through.  Either too busy to consider scheduling or has something else he wants to complete first.  Urged him to rethink this again.   Pt seen with Dr. Fleta.  Agree with evaluation and plan "

## 2024-11-15 NOTE — Progress Notes (Signed)
 CC- Here for f/u.  I, MD in training under Dr. CHRISTELLA,  did initial H/P, then presented the pt. to Dr. CHRISTELLA who also did her own eval., the final A/P , pt education and counseling.   1-BLE - Resolved for a month now. US  neg for DVT. Cardiac workup unremarkable.   Etiolgy unclear still. Got some elastic stocking in the mail which he has been wearing and elevating legs.  Taking Lasix  40 mg every day.   No complaints. - Cardiology note reviewed  2- HTN- Compliant with Metoprolol . BP's have been good for past month. HR 58 today. No CP or SOB. Because of risk of cocaine use, Carvadolol suggested by Cardiology and stopping Metoprolol .  3-Urination- Taking Tamsulosin .  Urinates 10-12 times a day with adequate urine. Wakes up 2 times at night.  Can start urinary stream and maintain it.   4- Tobacco -down to half a pack a day from 1 pack a day at the beginning of the year.  Chest CT neg 9/25. Not ready to do patches  5- +FIT- Says will do his colonoscopy after he does his right eye cataract. Hgb stable around 11-12  6- h/o cocaine- used it 2 times in the past 2 years.   7- Right eye cataract- wants to call Dr. Octavia next week and get that done,   8- DM and elev Cr- last HgbA1C 6.2 10/25  Allergies  Allergen Reactions   Lisinopril  Swelling    Complaint of tongue swelling never observed by medical personnel.  Switching to a different bp med with angioedema as possibility    Current Meds  Medication Sig   aspirin  EC 81 MG tablet Take 1 tablet (81 mg total) by mouth daily. Swallow whole.   empagliflozin  (JARDIANCE ) 10 MG TABS tablet Take 1 tablet (10 mg total) by mouth daily before breakfast.   furosemide  (LASIX ) 20 MG tablet Take 40 mg by mouth daily.   metoprolol  succinate (TOPROL -XL) 50 MG 24 hr tablet 1 tab by mouth with 25 mg Metoprolol  Succinate once daily with meal   rosuvastatin  (CRESTOR ) 10 MG tablet TAKE 1 TABLET BY MOUTH ONCE DAILY WITH MEALS   tamsulosin  (FLOMAX ) 0.4 MG CAPS capsule Take 1  capsule (0.4 mg total) by mouth daily after breakfast.   triamcinolone  cream (KENALOG ) 0.1 % Apply 1 Application topically 2 (two) times daily.    ROS Constitutional- sleep 7 hours a day No Vit D source. Eats 1 cup of milk/day.  Eats enough protein.  Not enough good fats.  Loves broccoli. Exercises.    Vitals:   11/15/24 1601  BP: 118/60  Pulse: (!) 58  Resp: 12    Physical Exam Constitutional:      Appearance: Normal appearance. He is normal weight.  Cardiovascular:     Comments: Equal radial pulses Pulmonary:     Effort: Pulmonary effort is normal.     Comments: Baeline crackles B Musculoskeletal:     Right lower leg: No edema.     Left lower leg: No edema.  Neurological:     Mental Status: He is oriented to person, place, and time.  Psychiatric:        Mood and Affect: Mood normal.        Behavior: Behavior normal.        Judgment: Judgment normal.     A/p 1- BLE- at goal -Continue leg elevation, stockings, lasix  40 mg every day, advised to watch salt intake  2- HTN- controlled. Due to concern for  possible cocaine use, D/C Metoprolol  and add carvedilol . - BP re check in 2 weeks  3- Right eye cataract- Pt will call Dr. Jens next week for surgery  4- +FIT -  Dr. CHRISTELLA explained the risk of colon cancer to pt and advised that he prioritize the colonoscopy.   But pt says he will do the colonoscopy after the right eye cataracts as his livelihood depends on the eyes.  5- Dentures broken- pt will deal with that after colonoscopy.  6- Possible Malnutrition- Start a multi-vitamin, add a cup of yogurt for now and add vit D 800 unites a day.   7-Urinating 10-12 times a day and 2 times a night -does not bother him right now. Monitor. On Tamsulosin .  Maybe due to a combination of Jardiance  and Dr. CHRISTELLA doing a trial without Tamsulosin  because the more I talk to the pt, the more it seems that his symptoms may not be related to urinary retention.   8- DM- mild elev in Hgb A1c  on  Jardiance - at goal . Monitor with Hgb A1C next set of labs.  9- High Cr- 1.3- 2- repeat with next set of labs.  10- Anemia-repeat CBC with next set of labs and await pt agreement to get colonoscopy.   11- Tobacco use d/o- continue to wean down the cigarets and counseled by Dr. CHRISTELLA to used patches when [facility] with pt.  Pt agreed to cut it to 1/4 pack per day when he is ready.  - Yearly Chest CT's. 12- Covid vaccine due- pt will get it at pharmacy.  Will list him for clinic vaccine as a back up.   RTC 2 weeks for BP check Next visit 4/25

## 2024-11-15 NOTE — Patient Instructions (Signed)
 Obtain Covid vaccine at pharmacy of choice

## 2024-12-03 ENCOUNTER — Ambulatory Visit: Admitting: Internal Medicine

## 2024-12-03 VITALS — BP 150/89 | HR 92

## 2024-12-03 DIAGNOSIS — Z0130 Encounter for examination of blood pressure without abnormal findings: Secondary | ICD-10-CM

## 2024-12-03 NOTE — Progress Notes (Unsigned)
 BP today = 150/80  He is taking carvidolo 6.25 , Furosemide  20 and his is stating that he is getting a pink pill but does not say a name.     Pr Dr.Mulberry.  Called him to schedule him for bp check in tow weeks , her mom answered and I gave her the massage to call us  back

## 2025-01-17 ENCOUNTER — Ambulatory Visit (INDEPENDENT_AMBULATORY_CARE_PROVIDER_SITE_OTHER): Admitting: Internal Medicine

## 2025-01-17 VITALS — BP 115/60 | HR 105

## 2025-01-17 DIAGNOSIS — Z0130 Encounter for examination of blood pressure without abnormal findings: Secondary | ICD-10-CM

## 2025-01-17 NOTE — Progress Notes (Unsigned)
 Bp today 115/60   pulse is 105  Carvidolol 6.25 , Furosamide 40 twice

## 2025-01-17 NOTE — Progress Notes (Signed)
 Notify furosemide  only once in morning.

## 2025-01-18 NOTE — Progress Notes (Signed)
 The notified him about Furosamide to get only once

## 2025-04-12 ENCOUNTER — Other Ambulatory Visit

## 2025-04-16 ENCOUNTER — Encounter: Admitting: Internal Medicine
